# Patient Record
Sex: Male | Born: 1980 | Race: Black or African American | Hispanic: No | Marital: Single | Smoking: Current every day smoker
Health system: Southern US, Community
[De-identification: ages and names within clinical notes are randomized; demographics above are authoritative.]

## PROBLEM LIST (undated history)

## (undated) DIAGNOSIS — I639 Cerebral infarction, unspecified: Secondary | ICD-10-CM

## (undated) DIAGNOSIS — F329 Major depressive disorder, single episode, unspecified: Secondary | ICD-10-CM

## (undated) DIAGNOSIS — F32A Depression, unspecified: Secondary | ICD-10-CM

## (undated) DIAGNOSIS — F1910 Other psychoactive substance abuse, uncomplicated: Secondary | ICD-10-CM

## (undated) DIAGNOSIS — T7840XA Allergy, unspecified, initial encounter: Secondary | ICD-10-CM

## (undated) DIAGNOSIS — Z789 Other specified health status: Secondary | ICD-10-CM

## (undated) HISTORY — PX: OTHER SURGICAL HISTORY: SHX169

## (undated) HISTORY — DX: Other psychoactive substance abuse, uncomplicated: F19.10

## (undated) HISTORY — DX: Allergy, unspecified, initial encounter: T78.40XA

## (undated) HISTORY — DX: Cerebral infarction, unspecified: I63.9

---
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
## 2005-06-14 ENCOUNTER — Emergency Department: Payer: Self-pay | Admitting: Emergency Medicine

## 2005-08-24 ENCOUNTER — Emergency Department: Payer: Self-pay | Admitting: Emergency Medicine

## 2005-10-25 ENCOUNTER — Emergency Department: Payer: Self-pay | Admitting: Unknown Physician Specialty

## 2005-11-17 ENCOUNTER — Emergency Department: Payer: Self-pay | Admitting: Emergency Medicine

## 2005-12-20 ENCOUNTER — Emergency Department: Payer: Self-pay | Admitting: Emergency Medicine

## 2007-01-18 ENCOUNTER — Emergency Department: Payer: Self-pay

## 2009-04-13 ENCOUNTER — Emergency Department: Payer: Self-pay | Admitting: Internal Medicine

## 2009-08-24 ENCOUNTER — Emergency Department (HOSPITAL_COMMUNITY): Admission: EM | Admit: 2009-08-24 | Discharge: 2009-08-24 | Payer: Self-pay | Admitting: Emergency Medicine

## 2009-09-04 ENCOUNTER — Emergency Department: Payer: Self-pay | Admitting: Emergency Medicine

## 2010-06-14 ENCOUNTER — Emergency Department: Payer: Self-pay | Admitting: Emergency Medicine

## 2011-08-08 ENCOUNTER — Emergency Department: Payer: Self-pay | Admitting: Unknown Physician Specialty

## 2011-08-08 LAB — COMPREHENSIVE METABOLIC PANEL
Albumin: 4.3 g/dL (ref 3.4–5.0)
Alkaline Phosphatase: 63 U/L (ref 50–136)
Anion Gap: 9 (ref 7–16)
BUN: 6 mg/dL — ABNORMAL LOW (ref 7–18)
Bilirubin,Total: 0.3 mg/dL (ref 0.2–1.0)
Calcium, Total: 8.6 mg/dL (ref 8.5–10.1)
Chloride: 109 mmol/L — ABNORMAL HIGH (ref 98–107)
Co2: 28 mmol/L (ref 21–32)
Creatinine: 0.73 mg/dL (ref 0.60–1.30)
EGFR (African American): >60
EGFR (Non-African Amer.): >60
Glucose: 91 mg/dL (ref 65–99)
Osmolality: 288 (ref 275–301)
Potassium: 3.8 mmol/L (ref 3.5–5.1)
SGOT(AST): 19 U/L (ref 15–37)
SGPT (ALT): 19 U/L
Sodium: 146 mmol/L — ABNORMAL HIGH (ref 136–145)
Total Protein: 8.1 g/dL (ref 6.4–8.2)

## 2011-08-08 LAB — CK TOTAL AND CKMB (NOT AT ARMC)
CK, Total: 122 U/L (ref 35–232)
CK-MB: 1.2 ng/mL (ref 0.5–3.6)

## 2011-08-08 LAB — CBC
HCT: 47.2 % (ref 40.0–52.0)
HGB: 15.8 g/dL (ref 13.0–18.0)
MCH: 31.7 pg (ref 26.0–34.0)
MCHC: 33.4 g/dL (ref 32.0–36.0)
MCV: 95 fL (ref 80–100)
Platelet: 188 10*3/uL (ref 150–440)
RBC: 4.98 10*6/uL (ref 4.40–5.90)
RDW: 14.2 % (ref 11.5–14.5)
WBC: 7.7 10*3/uL (ref 3.8–10.6)

## 2011-08-08 LAB — TROPONIN I: Troponin-I: <0.02 ng/mL

## 2014-01-21 ENCOUNTER — Emergency Department: Payer: Self-pay | Admitting: Emergency Medicine

## 2014-03-31 ENCOUNTER — Emergency Department: Payer: Self-pay | Admitting: Emergency Medicine

## 2014-12-05 ENCOUNTER — Emergency Department
Admission: EM | Admit: 2014-12-05 | Discharge: 2014-12-05 | Disposition: A | Discharge status: Home / Self Care | Payer: Self-pay | Attending: Emergency Medicine | Admitting: Emergency Medicine

## 2014-12-05 ENCOUNTER — Encounter: Payer: Self-pay | Admitting: Emergency Medicine

## 2014-12-05 DIAGNOSIS — X58XXXA Exposure to other specified factors, initial encounter: Secondary | ICD-10-CM | POA: Insufficient documentation

## 2014-12-05 DIAGNOSIS — Y990 Civilian activity done for income or pay: Secondary | ICD-10-CM | POA: Insufficient documentation

## 2014-12-05 DIAGNOSIS — Z789 Other specified health status: Secondary | ICD-10-CM

## 2014-12-05 DIAGNOSIS — S39012A Strain of muscle, fascia and tendon of lower back, initial encounter: Secondary | ICD-10-CM | POA: Insufficient documentation

## 2014-12-05 DIAGNOSIS — Y9289 Other specified places as the place of occurrence of the external cause: Secondary | ICD-10-CM | POA: Insufficient documentation

## 2014-12-05 DIAGNOSIS — Y9389 Activity, other specified: Secondary | ICD-10-CM | POA: Insufficient documentation

## 2014-12-05 DIAGNOSIS — T148XXA Other injury of unspecified body region, initial encounter: Secondary | ICD-10-CM

## 2014-12-05 HISTORY — DX: Other specified health status: Z78.9

## 2014-12-05 MED ORDER — CYCLOBENZAPRINE HCL 10 MG PO TABS
10.0000 mg | ORAL_TABLET | Freq: Once | ORAL | Status: AC
Start: 2014-12-05 — End: 2014-12-05
  Administered 2014-12-05: 10 mg via ORAL

## 2014-12-05 MED ORDER — IBUPROFEN 800 MG PO TABS: 800.0000 mg | ORAL_TABLET | Freq: Three times a day (TID) | ORAL | Status: DC | PRN

## 2014-12-05 MED ORDER — CYCLOBENZAPRINE HCL 5 MG PO TABS: 5.0000 mg | ORAL_TABLET | Freq: Three times a day (TID) | ORAL | Status: DC | PRN

## 2014-12-05 MED ORDER — IBUPROFEN 800 MG PO TABS
ORAL_TABLET | ORAL | Status: AC
  Filled 2014-12-05: qty 1

## 2014-12-05 MED ORDER — IBUPROFEN 800 MG PO TABS
800.0000 mg | ORAL_TABLET | Freq: Once | ORAL | Status: AC
  Administered 2014-12-05: 800 mg via ORAL

## 2014-12-05 MED ORDER — CYCLOBENZAPRINE HCL 10 MG PO TABS
ORAL_TABLET | ORAL | Status: AC
  Filled 2014-12-05: qty 1

## 2014-12-05 NOTE — Discharge Instructions (Signed)
Take medication as prescribed. Stretch well daily. Avoid strenuous activity.   Follow up with your primary care physician or the above.   Muscle Strain A muscle strain is an injury that occurs when a muscle is stretched beyond its normal length. Usually a small number of muscle fibers are torn when this happens. Muscle strain is rated in degrees. First-degree strains have the least amount of muscle fiber tearing and pain. Second-degree and third-degree strains have increasingly more tearing and pain.  Usually, recovery from muscle strain takes 1-2 weeks. Complete healing takes 5-6 weeks.  CAUSES  Muscle strain happens when a sudden, violent force placed on a muscle stretches it too far. This may occur with lifting, sports, or a fall.  RISK FACTORS Muscle strain is especially common in athletes.  SIGNS AND SYMPTOMS At the site of the muscle strain, there may be:  Pain.  Bruising.  Swelling.  Difficulty using the muscle due to pain or lack of normal function. DIAGNOSIS  Your health care provider will perform a physical exam and ask about your medical history. TREATMENT  Often, the best treatment for a muscle strain is resting, icing, and applying cold compresses to the injured area.  HOME CARE INSTRUCTIONS   Use the PRICE method of treatment to promote muscle healing during the first 2-3 days after your injury. The PRICE method involves:  Protecting the muscle from being injured again.  Restricting your activity and resting the injured body part.  Icing your injury. To do this, put ice in a plastic bag. Place a towel between your skin and the bag. Then, apply the ice and leave it on from 15-20 minutes each hour. After the third day, switch to moist heat packs.  Apply compression to the injured area with a splint or elastic bandage. Be careful not to wrap it too tightly. This may interfere with blood circulation or increase swelling.  Elevate the injured body part above the level  of your heart as often as you can.  Only take over-the-counter or prescription medicines for pain, discomfort, or fever as directed by your health care provider.  Warming up prior to exercise helps to prevent future muscle strains. SEEK MEDICAL CARE IF:   You have increasing pain or swelling in the injured area.  You have numbness, tingling, or a significant loss of strength in the injured area. MAKE SURE YOU:   Understand these instructions.  Will watch your condition.  Will get help right away if you are not doing well or get worse. Document Released: 06/28/2005 Document Revised: 04/18/2013 Document Reviewed: 01/25/2013 [facility] Patient Information 2015 [place], [place]. This information is not intended to replace advice given to you by your health care provider. Make sure you discuss any questions you have with your health care provider.

## 2014-12-05 NOTE — ED Provider Notes (Signed)
[facility] Emergency Department Provider Note  ____________________________________________  Time seen: Approximately 11:38 AM  I have reviewed the triage vital signs and the nursing notes.   HISTORY  Chief Complaint Back Pain   HPI Andrew Long is a 34 y.o. male presents for the complaint of low back pain. States pain intermittent in low back for approximately 6 months. Patient states that he has recently started new job with frequent heavy pushing and pulling. States 2 days ago at work he had to push a very heavy load and since then has had pain in his low back. Denies fall or injury. Reports pain gradual onset. States pain initially was just a burning sensation now sharp with movement. Patient states pain is currently only with movement.  States pain has intermittently been occurring similarly the last 6 months. States will gradually come on for 2 or 3 days then fully resolved. States pain currently 7 out of 10. Describes sharp with movement.  Denies pain radiation. Again denies fall or injury. Denies chest pain, shortness of breath, abdominal pain, nausea, vomiting, diarrhea, dysuria.   Denies workers comp Past Medical History  Diagnosis Date  . Patient denies medical problems 12/05/14    denies    There are no active problems to display for this patient.   Past Surgical History  Procedure Laterality Date  . Denies      denies surgeries    Current Outpatient Rx  Name  Route  Sig  Dispense  Refill  . cyclobenzaprine (FLEXERIL) 5 MG tablet   Oral   Take 1 tablet (5 mg total) by mouth every 8 (eight) hours as needed for muscle spasms (or pain. Do not drive or operate machinery while taking as can cause drowsiness.).   12 tablet   0   . ibuprofen (ADVIL,MOTRIN) 800 MG tablet   Oral   Take 1 tablet (800 mg total) by mouth every 8 (eight) hours as needed for mild pain or moderate pain.   15 tablet   0     Allergies Review of patient's  allergies indicates no known allergies.  No family history on file.  Social History History  Substance Use Topics  . Smoking status: Current Every Day Smoker  . Smokeless tobacco: Not on file  . Alcohol Use: Yes    Review of Systems Constitutional: No fever/chills Eyes: No visual changes. ENT: No sore throat. Cardiovascular: Denies chest pain. Respiratory: Denies shortness of breath. Gastrointestinal: No abdominal pain.  No nausea, no vomiting.  No diarrhea.  No constipation. Genitourinary: Negative for dysuria. Musculoskeletal: positive for back pain. Skin: Negative for rash. Neurological: Negative for headaches, focal weakness or numbness.  10-point ROS otherwise negative.  ____________________________________________   PHYSICAL EXAM:  VITAL SIGNS: ED Triage Vitals  Enc Vitals Group     BP 12/05/14 0917 124/81 mmHg     Pulse Rate 12/05/14 0917 102     Resp 12/05/14 0917 18     Temp 12/05/14 0917 98.1 F (36.7 C)     Temp Source 12/05/14 0917 Oral     SpO2 12/05/14 0917 100 %     Weight 12/05/14 0917 145 lb (65.772 kg)     Height 12/05/14 0917 6' (1.829 m)     Head Cir --      Peak Flow --      Pain Score 12/05/14 0933 10     Pain Loc --      Pain Edu? --  Excl. in GC? --     Constitutional: Sleeping soundly in room, awoke quickly with calling pt's name.Alert and oriented. Well appearing and in no acute distress. Eyes: Conjunctivae are normal. PERRL. EOMI. Head: Atraumatic. Nose: No congestion/rhinnorhea. Mouth/Throat: Mucous membranes are moist.  Oropharynx non-erythematous. Neck: No stridor.  No cervical spine tenderness to palpation. Hematological/Lymphatic/Immunilogical: No cervical lymphadenopathy. Cardiovascular: Normal rate, regular rhythm. Grossly normal heart sounds.  Good peripheral circulation. Respiratory: Normal respiratory effort.  No retractions. Lungs CTAB. Gastrointestinal: Soft and nontender. No distention. No abdominal bruits. No CVA  tenderness. Musculoskeletal: No lower extremity tenderness nor edema.  No joint effusions.Mild paralumbar tenderness to palpation. NO midline tenderness. Changes position from lying to standing very quickly without pain or distress. Bilateral straight leg  raise test negative. Distal pedal pulses equal bilaterally. No midline tenderness. No cervical or thoracic tenderness to palpation.  Neurologic:  Normal speech and language. No gross focal neurologic deficits are appreciated. Speech is normal. No gait instability. Skin:  Skin is warm, dry and intact. No rash noted. Psychiatric: Mood and affect are normal. Speech and behavior are normal.  ________________________________________  RADIOLOGY  Discussed these x-ray and evaluation. Patient refuses x-ray.  ____________________________________________    INITIAL IMPRESSION / ASSESSMENT AND PLAN / ED COURSE  Pertinent labs & imaging results that were available during my care of the patient were reviewed by me and considered in my medical decision making (see chart for details).  No acute distress. Very well-appearing patient. Ambulatory in the hallway. Changes position quickly without distress. No midline tenderness on exam. No recent fall or direct injury. Reports frequent pushing and pulling heavy items at work. Complains of pain that is sharp only with movement.   Upon entering the room for exam and patient sleeping in room, awoke quickly with verbal stimulation. Suspect muscular strain. Discussed proper body mechanics and stretching. Will treat with oral Flexeril as needed and ibuprofen. Follow up with primary care physician.__   FINAL CLINICAL IMPRESSION(S) / ED DIAGNOSES  Final diagnoses:  Lumbosacral strain, initial encounter  Muscle strain      Renford Dills, NP 12/05/14 1203  Richardean Canal, MD 12/07/14 [phone]
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
## 2014-12-05 NOTE — ED Notes (Signed)
Pt reports back pain for 6 months from lower back to his mid back. Pt reports was unable to go to work today because of it. Pt reports lifts heavy things at work. States he does not want to file WC.

## 2014-12-14 ENCOUNTER — Encounter: Payer: Self-pay | Admitting: Emergency Medicine

## 2014-12-14 ENCOUNTER — Emergency Department: Payer: Self-pay

## 2014-12-14 ENCOUNTER — Emergency Department
Admission: EM | Admit: 2014-12-14 | Discharge: 2014-12-14 | Disposition: A | Discharge status: Home / Self Care | Payer: Self-pay | Attending: Emergency Medicine | Admitting: Emergency Medicine

## 2014-12-14 DIAGNOSIS — W1839XA Other fall on same level, initial encounter: Secondary | ICD-10-CM | POA: Insufficient documentation

## 2014-12-14 DIAGNOSIS — Y9389 Activity, other specified: Secondary | ICD-10-CM | POA: Insufficient documentation

## 2014-12-14 DIAGNOSIS — Y998 Other external cause status: Secondary | ICD-10-CM | POA: Insufficient documentation

## 2014-12-14 DIAGNOSIS — S82401A Unspecified fracture of shaft of right fibula, initial encounter for closed fracture: Secondary | ICD-10-CM

## 2014-12-14 DIAGNOSIS — Y9289 Other specified places as the place of occurrence of the external cause: Secondary | ICD-10-CM | POA: Insufficient documentation

## 2014-12-14 DIAGNOSIS — Z720 Tobacco use: Secondary | ICD-10-CM | POA: Insufficient documentation

## 2014-12-14 DIAGNOSIS — S82831A Other fracture of upper and lower end of right fibula, initial encounter for closed fracture: Secondary | ICD-10-CM | POA: Insufficient documentation

## 2014-12-14 MED ORDER — IBUPROFEN 800 MG PO TABS
800.0000 mg | ORAL_TABLET | Freq: Three times a day (TID) | ORAL | Status: DC | PRN
Start: 2014-12-14 — End: 2015-10-08

## 2014-12-14 MED ORDER — HYDROCODONE-ACETAMINOPHEN 5-325 MG PO TABS
ORAL_TABLET | ORAL | Status: AC
  Filled 2014-12-14: qty 2

## 2014-12-14 MED ORDER — HYDROCODONE-ACETAMINOPHEN 5-325 MG PO TABS
2.0000 | ORAL_TABLET | Freq: Once | ORAL | Status: AC
  Administered 2014-12-14: 2 via ORAL

## 2014-12-14 MED ORDER — OXYCODONE-ACETAMINOPHEN 5-325 MG PO TABS: 1.0000 | ORAL_TABLET | ORAL | Status: DC | PRN

## 2014-12-14 NOTE — ED Provider Notes (Signed)
[facility] Emergency Department Provider Note  ____________________________________________  Time seen: Approximately 8:55 AM  I have reviewed the triage vital signs and the nursing notes.   HISTORY  Chief Complaint Ankle Pain    HPI Andrew Long is a 34 y.o. male presents emergency room for evaluation of right ankle pain. States he fell last night. Did not present because it had "too much fine". As of increased swelling and pain to right ankle today. Unable to bear weight. Rates pain is 10 over 10.   Past Medical History  Diagnosis Date  . Patient denies medical problems 12/05/14    denies    There are no active problems to display for this patient.   Past Surgical History  Procedure Laterality Date  . Denies      denies surgeries    Current Outpatient Rx  Name  Route  Sig  Dispense  Refill  . ibuprofen (ADVIL,MOTRIN) 800 MG tablet   Oral   Take 1 tablet (800 mg total) by mouth every 8 (eight) hours as needed.   30 tablet   0   . oxyCODONE-acetaminophen (ROXICET) 5-325 MG per tablet   Oral   Take 1-2 tablets by mouth every 4 (four) hours as needed for severe pain.   15 tablet   0     Allergies Review of patient's allergies indicates no known allergies.  No family history on file.  Social History History  Substance Use Topics  . Smoking status: Current Every Day Smoker -- 0.30 packs/day    Types: Cigarettes  . Smokeless tobacco: Not on file  . Alcohol Use: 8.4 oz/week    14 Standard drinks or equivalent per week    Review of Systems Constitutional: No fever/chills Eyes: No visual changes. ENT: No sore throat. Cardiovascular: Denies chest pain. Respiratory: Denies shortness of breath. Gastrointestinal: No abdominal pain.  No nausea, no vomiting.  No diarrhea.  No constipation. Genitourinary: Negative for dysuria. Musculoskeletal: Positive for right ankle pain. Skin: Negative for rash. Neurological: Negative for  headaches, focal weakness or numbness.  10-point ROS otherwise negative.  ____________________________________________   PHYSICAL EXAM:  VITAL SIGNS: ED Triage Vitals  Enc Vitals Group     BP --      Pulse --      Resp --      Temp --      Temp src --      SpO2 --      Weight --      Height --      Head Cir --      Peak Flow --      Pain Score --      Pain Loc --      Pain Edu? --      Excl. in GC? --     Constitutional: Alert and oriented. Well appearing and in no acute distress. Eyes: Conjunctivae are normal. PERRL. EOMI. Head: Atraumatic. Nose: No congestion/rhinnorhea. Mouth/Throat: Mucous membranes are moist.  Oropharynx non-erythematous. Neck: No stridor.   Cardiovascular: Normal rate, regular rhythm. Grossly normal heart sounds.  Good peripheral circulation. Respiratory: Normal respiratory effort.  No retractions. Lungs CTAB. Gastrointestinal: Soft and nontender. No distention. No abdominal bruits. No CVA tenderness. Musculoskeletal: Positive right lower ankle extremity injury with swelling. Decreased range of motion unable to flex extend abduct or abduct Neurologic:  Normal speech and language. No gross focal neurologic deficits are appreciated. Speech is normal. Gait not tested due to pain. Neurovascularly intact distally.  Skin:  Skin is warm, dry and intact. No rash noted. Psychiatric: Mood and affect are normal. Speech and behavior are normal.  ____________________________________________   LABS (all labs ordered are listed, but only abnormal results are displayed)  Labs Reviewed - No data to display ____________________________________________  EKG  Deferred ____________________________________________  RADIOLOGY turbid by radiologist, reviewed by myself. Distal fibular fracture ____________________________________________   PROCEDURES  Procedure(s) performed: None  Critical Care performed:  No  ____________________________________________   INITIAL IMPRESSION / ASSESSMENT AND PLAN / ED COURSE  Pertinent labs & imaging results that were available during my care of the patient were reviewed by me and considered in my medical decision making (see chart for details).  Discussed all findings with patient. Diagnosed with a fibular fracture. Stirrup splint placed Rx given for Percocet and Motrin crutches provided. Patient follow-up with orthopedics as directed on-call information given. Patient voices no other emergency medical complaints at this time. ____________________________________________   FINAL CLINICAL IMPRESSION(S) / ED DIAGNOSES  Final diagnoses:  Right fibular fracture, closed, initial encounter       Evangeline Dakin, PA-C 12/14/14 4098  Governor Rooks, MD 12/14/14 [phone]

## 2014-12-14 NOTE — ED Notes (Signed)
Fell last night, pain R ankle, denies other injury

## 2014-12-14 NOTE — Discharge Instructions (Signed)
Fibular Fracture, Ankle, Adult, Undisplaced, Treated With Immobilization °A simple fracture of the bone below the knee on the outside of your leg (fibula) usually heals without problems. °CAUSES °Typically, a fibular fracture occurs as a result of trauma. A blow to the side of your leg or a powerful twisting movement can cause a fracture. Fibular fractures are often seen as a result of football, soccer, or skiing injuries. °SYMPTOMS °Symptoms of a fibular fracture can include: °· Pain. °· Shortening or abnormal alignment of your lower leg (angulation). °DIAGNOSIS °A health care provider will need to examine the leg. X-ray exams will be ordered for further to confirm the fracture and evaluate the extent and of the injury. °TREATMENT  °Typically, a cast or immobilizer is applied. Sometimes a splint is placed on these fractures if it is needed for comfort or if the bones are badly out of place. Crutches may be needed to help you get around.  °HOME CARE INSTRUCTIONS  °· Apply ice to the injured area: °¨ Put ice in a plastic bag. °¨ Place a towel between your skin and the bag. °¨ Leave the ice on for 20 minutes, 2-3 times a day. °· Use crutches as directed. Resume walking without crutches as directed by your health care provider or when comfortable doing so. °· Only take over-the-counter or prescription medicines for pain, discomfort, or fever as directed by your health care provider. °· Keeping your leg raised may lessen swelling. °· If you have a removable splint or boot, do not remove the boot unless directed by your health care provider. °· Do not not drive a car or operate a motor vehicle until your health care provider specifically tells you it is safe to do so. °SEEK IMMEDIATE MEDICAL CARE IF:  °· Your cast gets damaged or breaks. °· You have continued severe pain or more swelling than you did before the cast was put on, or the pain is not controlled with medications. °· Your skin or nails below the injury turn  blue or grey, or feel cold or numb. °· There is a bad smell or pus coming from under the cast. °· You develop severe pain in ankle or foot. °MAKE SURE YOU:  °· Understand these instructions. °· Will watch your condition. °· Will get help right away if you are not doing well or get worse. °Document Released: 03/20/2002 Document Revised: 04/18/2013 Document Reviewed: 02/07/2013 °ExitCare® Patient Information ©2015 [employer]. This information is not intended to replace advice given to you by your health care provider. Make sure you discuss any questions you have with your health care provider. ° °

## 2015-10-08 ENCOUNTER — Emergency Department
Admission: EM | Admit: 2015-10-08 | Discharge: 2015-10-08 | Disposition: A | Discharge status: Home / Self Care | Payer: Self-pay | Attending: Emergency Medicine | Admitting: Emergency Medicine

## 2015-10-08 ENCOUNTER — Encounter: Payer: Self-pay | Admitting: Emergency Medicine

## 2015-10-08 DIAGNOSIS — X501XXA Overexertion from prolonged static or awkward postures, initial encounter: Secondary | ICD-10-CM | POA: Insufficient documentation

## 2015-10-08 DIAGNOSIS — Y9389 Activity, other specified: Secondary | ICD-10-CM | POA: Insufficient documentation

## 2015-10-08 DIAGNOSIS — Y929 Unspecified place or not applicable: Secondary | ICD-10-CM | POA: Insufficient documentation

## 2015-10-08 DIAGNOSIS — Y998 Other external cause status: Secondary | ICD-10-CM | POA: Insufficient documentation

## 2015-10-08 DIAGNOSIS — S39012A Strain of muscle, fascia and tendon of lower back, initial encounter: Secondary | ICD-10-CM | POA: Insufficient documentation

## 2015-10-08 DIAGNOSIS — F17210 Nicotine dependence, cigarettes, uncomplicated: Secondary | ICD-10-CM | POA: Insufficient documentation

## 2015-10-08 MED ORDER — OXYCODONE-ACETAMINOPHEN 5-325 MG PO TABS: 1.0000 | ORAL_TABLET | ORAL | Status: DC | PRN

## 2015-10-08 MED ORDER — NAPROXEN 500 MG PO TABS: 500.0000 mg | ORAL_TABLET | Freq: Two times a day (BID) | ORAL | Status: DC

## 2015-10-08 MED ORDER — CYCLOBENZAPRINE HCL 10 MG PO TABS
10.0000 mg | ORAL_TABLET | Freq: Once | ORAL | Status: AC
  Administered 2015-10-08: 10 mg via ORAL
  Filled 2015-10-08: qty 1

## 2015-10-08 MED ORDER — OXYCODONE-ACETAMINOPHEN 5-325 MG PO TABS
2.0000 | ORAL_TABLET | Freq: Once | ORAL | Status: AC
  Administered 2015-10-08: 2 via ORAL
  Filled 2015-10-08: qty 2

## 2015-10-08 MED ORDER — CYCLOBENZAPRINE HCL 10 MG PO TABS: 10.0000 mg | ORAL_TABLET | Freq: Three times a day (TID) | ORAL | Status: DC | PRN

## 2015-10-08 MED ORDER — KETOROLAC TROMETHAMINE 30 MG/ML IJ SOLN
30.0000 mg | Freq: Once | INTRAMUSCULAR | Status: AC
  Administered 2015-10-08: 30 mg via INTRAMUSCULAR
  Filled 2015-10-08: qty 1

## 2015-10-08 NOTE — ED Provider Notes (Signed)
[facility] Emergency Department Provider Note  ____________________________________________  Time seen: Approximately 11:57 AM  I have reviewed the triage vital signs and the nursing notes.   HISTORY  Chief Complaint Back Pain    HPI Andrew Long is a 35 y.o. male presents for evaluation of mid to lower back pain. Patient states that he was moving furniture yesterday when he twisted and fell some point in his lower back. Complains of difficulty ambulating and sleeping secondary to the pain. Has tried Motrin over-the-counter with no relief. Scribe's pain as a 10 over 10, nonradiating no numbness or tingling.   Past Medical History  Diagnosis Date  . Patient denies medical problems 12/05/14    denies    There are no active problems to display for this patient.   Past Surgical History  Procedure Laterality Date  . Denies      denies surgeries    Current Outpatient Rx  Name  Route  Sig  Dispense  Refill  . cyclobenzaprine (FLEXERIL) 10 MG tablet   Oral   Take 1 tablet (10 mg total) by mouth every 8 (eight) hours as needed for muscle spasms.   30 tablet   1   . naproxen (NAPROSYN) 500 MG tablet   Oral   Take 1 tablet (500 mg total) by mouth 2 (two) times daily with a meal.   60 tablet   0   . oxyCODONE-acetaminophen (ROXICET) 5-325 MG tablet   Oral   Take 1-2 tablets by mouth every 4 (four) hours as needed for severe pain.   15 tablet   0     Allergies Tramadol  No family history on file.  Social History Social History  Substance Use Topics  . Smoking status: Current Every Day Smoker -- 0.30 packs/day    Types: Cigarettes  . Smokeless tobacco: None  . Alcohol Use: 8.4 oz/week    14 Standard drinks or equivalent per week    Review of Systems Constitutional: No fever/chills Cardiovascular: Denies chest pain. Respiratory: Denies shortness of breath. Gastrointestinal: No abdominal pain.  No nausea, no vomiting.  No diarrhea.   No constipation. Genitourinary: Negative for dysuria. Musculoskeletal: Positive for low back pain. Skin: Negative for rash. Neurological: Negative for headaches, focal weakness or numbness.  10-point ROS otherwise negative.  ____________________________________________   PHYSICAL EXAM:  VITAL SIGNS: ED Triage Vitals  Enc Vitals Group     BP 10/08/15 1130 124/77 mmHg     Pulse Rate 10/08/15 1130 128     Resp 10/08/15 1130 18     Temp 10/08/15 1130 98.1 F (36.7 C)     Temp Source 10/08/15 1130 Oral     SpO2 10/08/15 1130 98 %     Weight 10/08/15 1130 145 lb (65.772 kg)     Height 10/08/15 1130  (1.803 m)     Head Cir --      Peak Flow --      Pain Score 10/08/15 1130 10     Pain Loc --      Pain Edu? --      Excl. in GC? --     Constitutional: Alert and oriented. Well appearing and in no acute distress.  Cardiovascular: Normal rate, regular rhythm. Grossly normal heart sounds.  Good peripheral circulation. Respiratory: Normal respiratory effort.  No retractions. Lungs CTAB. Musculoskeletal: Point tenderness to the paraspinal lumbar muscle area. Straight leg raise positive at 20 bilaterally. Ambulates methodically and with a limp. Distally neurovascularly  and intact reflexes equal bilaterally Neurologic:  Normal speech and language. No gross focal neurologic deficits are appreciated. No gait instability. Skin:  Skin is warm, dry and intact. No rash noted. Psychiatric: Mood and affect are normal. Speech and behavior are normal.  ____________________________________________   LABS (all labs ordered are listed, but only abnormal results are displayed)  Labs Reviewed - No data to display ____________________________________________    PROCEDURES  Procedure(s) performed: None  Critical Care performed: No  ____________________________________________   INITIAL IMPRESSION / ASSESSMENT AND PLAN / ED COURSE  Pertinent labs & imaging results that were  available during my care of the patient were reviewed by me and considered in my medical decision making (see chart for details).  Acute lumbar sacral strain. Patient was given Percocet and Flexeril while in the ED. Rx given for Flexeril 10 mg 3 times a day, Naprosyn 500 mg 3 times a day and Percocet 5/325 as needed for severe pain only. Patient follow-up with PCP continue to use heat and ice as needed for comfort. Work excuse given 48 hours. ____________________________________________   FINAL CLINICAL IMPRESSION(S) / ED DIAGNOSES  Final diagnoses:  Lumbar strain, initial encounter     This chart was dictated using voice recognition software/Dragon. Despite best efforts to proofread, errors can occur which can change the meaning. Any change was purely unintentional.   Evangeline Dakinharles M Melissa Tomaselli, PA-C 10/08/15 1208  Emily FilbertJonathan E Williams, MD 10/08/15 1215

## 2015-10-08 NOTE — ED Notes (Signed)
Pt presents to ED with mid back pain. Pt states moving furniture yesterday.

## 2015-10-08 NOTE — Discharge Instructions (Signed)

## 2016-11-29 ENCOUNTER — Emergency Department: Payer: Self-pay

## 2016-11-29 ENCOUNTER — Emergency Department
Admission: EM | Admit: 2016-11-29 | Discharge: 2016-11-29 | Disposition: A | Discharge status: Home / Self Care | Payer: Self-pay | Attending: Emergency Medicine | Admitting: Emergency Medicine

## 2016-11-29 DIAGNOSIS — S300XXA Contusion of lower back and pelvis, initial encounter: Secondary | ICD-10-CM | POA: Insufficient documentation

## 2016-11-29 DIAGNOSIS — Y9389 Activity, other specified: Secondary | ICD-10-CM | POA: Insufficient documentation

## 2016-11-29 DIAGNOSIS — F17210 Nicotine dependence, cigarettes, uncomplicated: Secondary | ICD-10-CM | POA: Insufficient documentation

## 2016-11-29 DIAGNOSIS — S4992XA Unspecified injury of left shoulder and upper arm, initial encounter: Secondary | ICD-10-CM

## 2016-11-29 DIAGNOSIS — Y929 Unspecified place or not applicable: Secondary | ICD-10-CM | POA: Insufficient documentation

## 2016-11-29 DIAGNOSIS — S50312A Abrasion of left elbow, initial encounter: Secondary | ICD-10-CM | POA: Insufficient documentation

## 2016-11-29 DIAGNOSIS — Y999 Unspecified external cause status: Secondary | ICD-10-CM | POA: Insufficient documentation

## 2016-11-29 MED ORDER — FENTANYL CITRATE (PF) 100 MCG/2ML IJ SOLN
50.0000 ug | Freq: Once | INTRAMUSCULAR | Status: AC
  Administered 2016-11-29: 50 ug via INTRAVENOUS
  Filled 2016-11-29: qty 2
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
## 2016-11-29 NOTE — ED Notes (Signed)
Pt refused ice pack, left ice pack at bedside
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
## 2016-11-29 NOTE — ED Notes (Signed)
[place] at bedside talking to pt, pt ambulatory to restroom with no assistance steady gait.

## 2016-11-30 ENCOUNTER — Emergency Department
Admission: EM | Admit: 2016-11-30 | Discharge: 2016-11-30 | Disposition: A | Discharge status: Home / Self Care | Payer: Self-pay | Attending: Emergency Medicine | Admitting: Emergency Medicine

## 2016-11-30 ENCOUNTER — Emergency Department: Payer: Self-pay

## 2016-11-30 ENCOUNTER — Encounter: Payer: Self-pay | Admitting: Emergency Medicine

## 2016-11-30 DIAGNOSIS — Y939 Activity, unspecified: Secondary | ICD-10-CM | POA: Insufficient documentation

## 2016-11-30 DIAGNOSIS — Y929 Unspecified place or not applicable: Secondary | ICD-10-CM | POA: Insufficient documentation

## 2016-11-30 DIAGNOSIS — F17210 Nicotine dependence, cigarettes, uncomplicated: Secondary | ICD-10-CM | POA: Insufficient documentation

## 2016-11-30 DIAGNOSIS — S82092A Other fracture of left patella, initial encounter for closed fracture: Secondary | ICD-10-CM | POA: Insufficient documentation

## 2016-11-30 DIAGNOSIS — S20212A Contusion of left front wall of thorax, initial encounter: Secondary | ICD-10-CM | POA: Insufficient documentation

## 2016-11-30 DIAGNOSIS — Y999 Unspecified external cause status: Secondary | ICD-10-CM | POA: Insufficient documentation

## 2016-11-30 MED ORDER — IBUPROFEN 600 MG PO TABS
600.0000 mg | ORAL_TABLET | Freq: Once | ORAL | Status: AC
  Administered 2016-11-30: 600 mg via ORAL
  Filled 2016-11-30: qty 1

## 2016-11-30 MED ORDER — IBUPROFEN 600 MG PO TABS: 600.0000 mg | ORAL_TABLET | Freq: Three times a day (TID) | ORAL | 0 refills | Status: DC | PRN

## 2016-11-30 MED ORDER — HYDROCODONE-ACETAMINOPHEN 5-325 MG PO TABS
1.0000 | ORAL_TABLET | Freq: Once | ORAL | Status: AC
  Administered 2016-11-30: 1 via ORAL
  Filled 2016-11-30: qty 1

## 2016-11-30 MED ORDER — HYDROCODONE-ACETAMINOPHEN 5-325 MG PO TABS: ORAL_TABLET | ORAL | 0 refills | Status: DC

## 2016-11-30 NOTE — ED Notes (Signed)
NAD noted at time of D/C. Pt taken to the lobby via wheelchair. Pt denies comments/concerns regarding  D/C at this time.  
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
## 2016-11-30 NOTE — Discharge Instructions (Signed)
Ice and elevate your knee when at home. Wear knee immobilizer for support and use crutches. You do not have to wear your knee immobilizer while sleeping. Call make an appointment with Dr. Ernest PineHooten.  Take pain medication only as directed 1 tablet every 4-6 hours if needed for pain. Ibuprofen 600 mg 1 every 8 hours with food.
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
## 2017-02-25 ENCOUNTER — Emergency Department
Admission: EM | Admit: 2017-02-25 | Discharge: 2017-02-25 | Disposition: A | Discharge status: Home / Self Care | Payer: Self-pay | Attending: Emergency Medicine | Admitting: Emergency Medicine

## 2017-02-25 DIAGNOSIS — F329 Major depressive disorder, single episode, unspecified: Secondary | ICD-10-CM | POA: Insufficient documentation

## 2017-02-25 DIAGNOSIS — F10920 Alcohol use, unspecified with intoxication, uncomplicated: Secondary | ICD-10-CM | POA: Insufficient documentation

## 2017-02-25 DIAGNOSIS — F1410 Cocaine abuse, uncomplicated: Secondary | ICD-10-CM | POA: Insufficient documentation

## 2017-02-25 DIAGNOSIS — F32A Depression, unspecified: Secondary | ICD-10-CM

## 2017-02-25 DIAGNOSIS — F17210 Nicotine dependence, cigarettes, uncomplicated: Secondary | ICD-10-CM | POA: Insufficient documentation

## 2017-02-25 DIAGNOSIS — R45851 Suicidal ideations: Secondary | ICD-10-CM | POA: Insufficient documentation

## 2017-02-25 LAB — CBC
HCT: 42.2 % (ref 40.0–52.0)
Hemoglobin: 14.3 g/dL (ref 13.0–18.0)
MCH: 30.7 pg (ref 26.0–34.0)
MCHC: 33.8 g/dL (ref 32.0–36.0)
MCV: 90.9 fL (ref 80.0–100.0)
Platelets: 205 10*3/uL (ref 150–440)
RBC: 4.65 MIL/uL (ref 4.40–5.90)
RDW: 16.2 % — ABNORMAL HIGH (ref 11.5–14.5)
WBC: 5.7 10*3/uL (ref 3.8–10.6)

## 2017-02-25 LAB — COMPREHENSIVE METABOLIC PANEL
ALT: 38 U/L (ref 17–63)
AST: 47 U/L — ABNORMAL HIGH (ref 15–41)
Albumin: 4.4 g/dL (ref 3.5–5.0)
Alkaline Phosphatase: 60 U/L (ref 38–126)
Anion gap: 9 (ref 5–15)
BUN: 6 mg/dL (ref 6–20)
CO2: 24 mmol/L (ref 22–32)
Calcium: 8.7 mg/dL — ABNORMAL LOW (ref 8.9–10.3)
Chloride: 109 mmol/L (ref 101–111)
Creatinine, Ser: 0.79 mg/dL (ref 0.61–1.24)
GFR calc Af Amer: >60 mL/min (ref >60)
GFR calc non Af Amer: >60 mL/min (ref >60)
Glucose, Bld: 98 mg/dL (ref 65–99)
Potassium: 3.6 mmol/L (ref 3.5–5.1)
Sodium: 142 mmol/L (ref 135–145)
Total Bilirubin: 0.7 mg/dL (ref 0.3–1.2)
Total Protein: 8.2 g/dL — ABNORMAL HIGH (ref 6.5–8.1)

## 2017-02-25 LAB — ETHANOL: Alcohol, Ethyl (B): 230 mg/dL — ABNORMAL HIGH (ref <5)

## 2017-02-25 LAB — URINE DRUG SCREEN, QUALITATIVE (ARMC ONLY)
Amphetamines, Ur Screen: NOT DETECTED
Barbiturates, Ur Screen: NOT DETECTED
Benzodiazepine, Ur Scrn: NOT DETECTED
Cannabinoid 50 Ng, Ur ~~LOC~~: NOT DETECTED
Cocaine Metabolite,Ur ~~LOC~~: POSITIVE — AB
MDMA (Ecstasy)Ur Screen: NOT DETECTED
Methadone Scn, Ur: NOT DETECTED
Opiate, Ur Screen: NOT DETECTED
Phencyclidine (PCP) Ur S: NOT DETECTED
Tricyclic, Ur Screen: NOT DETECTED

## 2017-02-25 LAB — SALICYLATE LEVEL: Salicylate Lvl: <7 mg/dL (ref 2.8–30.0)

## 2017-02-25 LAB — ACETAMINOPHEN LEVEL: Acetaminophen (Tylenol), Serum: <10 ug/mL — ABNORMAL LOW (ref 10–30)

## 2017-02-25 NOTE — Discharge Instructions (Signed)
Return to the ER for worsening symptoms, feelings of hurting yourself or others, or concerns.

## 2017-02-25 NOTE — ED Triage Notes (Signed)
Patient c/o suicidal ideation with a plan.  Refuses to tell this RN what his plan is.
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
## 2017-02-25 NOTE — BH Assessment (Signed)
Spoke with patient to complete TTS consult.  He stated he is suicidal due to the many things that have been going on with him.  He denies having a plan  He is currently homeless [place] the [place].  He reported he had 2 24 oz beers earlier.  His BAC level was 230 when he arrived.  He denied any other substance use.  The UDA has a positive result for cocaine.  He was very drowsy during the assessment and was falling asleep during the assessment.  When he did awake his speech was slurred and it was difficult to understand the patient.  The patient will be assessed when he is more alert.

## 2017-02-25 NOTE — ED Notes (Signed)
Pt. Verbalizes understanding of d/c instructions and follow-up. VS stable and pt denies pain.  Pt. In NAD at time of d/c and denies further concerns regarding this visit. Pt. Stable at the time of departure from the unit, departing unit by the safest and most appropriate manner per that pt condition and limitations. Pt advised to return to the ED at any time for emergent concerns, or for new/worsening symptoms.   

## 2017-12-27 ENCOUNTER — Encounter: Payer: Self-pay | Admitting: Emergency Medicine

## 2017-12-27 ENCOUNTER — Emergency Department: Payer: BLUE CROSS/BLUE SHIELD

## 2017-12-27 ENCOUNTER — Other Ambulatory Visit: Payer: Self-pay

## 2017-12-27 ENCOUNTER — Emergency Department
Admission: EM | Admit: 2017-12-27 | Discharge: 2017-12-27 | Disposition: A | Discharge status: Home / Self Care | Payer: BLUE CROSS/BLUE SHIELD | Attending: Emergency Medicine | Admitting: Emergency Medicine

## 2017-12-27 DIAGNOSIS — Y929 Unspecified place or not applicable: Secondary | ICD-10-CM | POA: Insufficient documentation

## 2017-12-27 DIAGNOSIS — W2209XA Striking against other stationary object, initial encounter: Secondary | ICD-10-CM | POA: Diagnosis not present

## 2017-12-27 DIAGNOSIS — F17210 Nicotine dependence, cigarettes, uncomplicated: Secondary | ICD-10-CM | POA: Diagnosis not present

## 2017-12-27 DIAGNOSIS — S6991XA Unspecified injury of right wrist, hand and finger(s), initial encounter: Secondary | ICD-10-CM

## 2017-12-27 DIAGNOSIS — Y999 Unspecified external cause status: Secondary | ICD-10-CM | POA: Diagnosis not present

## 2017-12-27 DIAGNOSIS — M79641 Pain in right hand: Secondary | ICD-10-CM | POA: Diagnosis not present

## 2017-12-27 DIAGNOSIS — Y939 Activity, unspecified: Secondary | ICD-10-CM | POA: Insufficient documentation

## 2017-12-27 MED ORDER — KETOROLAC TROMETHAMINE 10 MG PO TABS: 10.0000 mg | ORAL_TABLET | Freq: Four times a day (QID) | ORAL | 0 refills | Status: DC | PRN

## 2017-12-27 MED ORDER — KETOROLAC TROMETHAMINE 30 MG/ML IJ SOLN
30.0000 mg | Freq: Once | INTRAMUSCULAR | Status: AC
  Administered 2017-12-27: 30 mg via INTRAMUSCULAR
  Filled 2017-12-27: qty 1

## 2017-12-27 NOTE — ED Notes (Addendum)
Pt was informed that he needed to wait 20 min and then would be discharged - immediately after receiving injection the patient left the room - will wait 5 min to see if pt returns
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
## 2017-12-27 NOTE — ED Notes (Signed)
Pt returned to room - provider aware
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
## 2018-07-07 ENCOUNTER — Other Ambulatory Visit: Payer: Self-pay

## 2018-07-07 ENCOUNTER — Emergency Department
Admission: EM | Admit: 2018-07-07 | Discharge: 2018-07-07 | Disposition: A | Discharge status: Home / Self Care | Payer: BLUE CROSS/BLUE SHIELD | Attending: Student in an Organized Health Care Education/Training Program | Admitting: Student in an Organized Health Care Education/Training Program

## 2018-07-07 ENCOUNTER — Encounter: Payer: Self-pay | Admitting: Emergency Medicine

## 2018-07-07 DIAGNOSIS — W1839XA Other fall on same level, initial encounter: Secondary | ICD-10-CM | POA: Insufficient documentation

## 2018-07-07 DIAGNOSIS — Y939 Activity, unspecified: Secondary | ICD-10-CM | POA: Insufficient documentation

## 2018-07-07 DIAGNOSIS — Y999 Unspecified external cause status: Secondary | ICD-10-CM | POA: Insufficient documentation

## 2018-07-07 DIAGNOSIS — F17210 Nicotine dependence, cigarettes, uncomplicated: Secondary | ICD-10-CM | POA: Insufficient documentation

## 2018-07-07 DIAGNOSIS — S61511A Laceration without foreign body of right wrist, initial encounter: Secondary | ICD-10-CM

## 2018-07-07 DIAGNOSIS — Y929 Unspecified place or not applicable: Secondary | ICD-10-CM | POA: Insufficient documentation

## 2018-07-07 MED ORDER — LIDOCAINE HCL (PF) 1 % IJ SOLN
10.0000 mL | Freq: Once | INTRAMUSCULAR | Status: AC
  Administered 2018-07-07: 10 mL
  Filled 2018-07-07: qty 10
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
## 2018-07-14 ENCOUNTER — Emergency Department
Admission: EM | Admit: 2018-07-14 | Discharge: 2018-07-14 | Disposition: A | Discharge status: Home / Self Care | Payer: Self-pay | Attending: Emergency Medicine | Admitting: Emergency Medicine

## 2018-07-14 ENCOUNTER — Other Ambulatory Visit: Payer: Self-pay

## 2018-07-14 DIAGNOSIS — W25XXXD Contact with sharp glass, subsequent encounter: Secondary | ICD-10-CM | POA: Insufficient documentation

## 2018-07-14 DIAGNOSIS — Z4802 Encounter for removal of sutures: Secondary | ICD-10-CM

## 2018-07-14 DIAGNOSIS — F17210 Nicotine dependence, cigarettes, uncomplicated: Secondary | ICD-10-CM | POA: Insufficient documentation

## 2018-07-14 DIAGNOSIS — S61511D Laceration without foreign body of right wrist, subsequent encounter: Secondary | ICD-10-CM | POA: Insufficient documentation

## 2018-07-14 MED ORDER — BACITRACIN ZINC 500 UNIT/GM EX OINT: TOPICAL_OINTMENT | Freq: Two times a day (BID) | CUTANEOUS | Status: DC

## 2018-07-14 MED ORDER — BACITRACIN ZINC 500 UNIT/GM EX OINT
TOPICAL_OINTMENT | Freq: Once | CUTANEOUS | Status: DC
  Filled 2018-07-14: qty 0.9
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
## 2018-07-14 NOTE — ED Notes (Signed)
Pt here for suture removal. Sutures removed by PA.

## 2018-07-14 NOTE — ED Notes (Signed)
Pt left prior to discharge review. No signature obtained.
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
## 2018-07-14 NOTE — ED Notes (Signed)
Pt left prior to discharge paper review. RN waiting for medication from pharmacy. Pt left prior to receiving medication and RN unable to obtain signature for discharge.

## 2018-07-30 ENCOUNTER — Encounter: Payer: Self-pay | Admitting: Emergency Medicine

## 2018-07-30 ENCOUNTER — Other Ambulatory Visit: Payer: Self-pay

## 2018-07-30 DIAGNOSIS — K2920 Alcoholic gastritis without bleeding: Secondary | ICD-10-CM | POA: Insufficient documentation

## 2018-07-30 DIAGNOSIS — F10920 Alcohol use, unspecified with intoxication, uncomplicated: Secondary | ICD-10-CM | POA: Insufficient documentation

## 2018-07-30 DIAGNOSIS — F17210 Nicotine dependence, cigarettes, uncomplicated: Secondary | ICD-10-CM | POA: Insufficient documentation

## 2018-07-30 LAB — URINALYSIS, COMPLETE (UACMP) WITH MICROSCOPIC
Bacteria, UA: NONE SEEN
Bilirubin Urine: NEGATIVE
Glucose, UA: NEGATIVE mg/dL
Ketones, ur: 5 mg/dL — AB
Leukocytes, UA: NEGATIVE
Nitrite: NEGATIVE
Protein, ur: 30 mg/dL — AB
Specific Gravity, Urine: 1.018 (ref 1.005–1.030)
pH: 5 (ref 5.0–8.0)

## 2018-07-30 LAB — CBC
HCT: 48.4 % (ref 39.0–52.0)
Hemoglobin: 16.2 g/dL (ref 13.0–17.0)
MCH: 29.9 pg (ref 26.0–34.0)
MCHC: 33.5 g/dL (ref 30.0–36.0)
MCV: 89.3 fL (ref 80.0–100.0)
Platelets: 346 10*3/uL (ref 150–400)
RBC: 5.42 MIL/uL (ref 4.22–5.81)
RDW: 14.9 % (ref 11.5–15.5)
WBC: 8.7 10*3/uL (ref 4.0–10.5)
nRBC: 0 % (ref 0.0–0.2)

## 2018-07-30 LAB — COMPREHENSIVE METABOLIC PANEL
ALT: 25 U/L (ref 0–44)
AST: 26 U/L (ref 15–41)
Albumin: 4.9 g/dL (ref 3.5–5.0)
Alkaline Phosphatase: 78 U/L (ref 38–126)
Anion gap: 13 (ref 5–15)
BUN: 12 mg/dL (ref 6–20)
CO2: 24 mmol/L (ref 22–32)
Calcium: 9 mg/dL (ref 8.9–10.3)
Chloride: 104 mmol/L (ref 98–111)
Creatinine, Ser: 0.7 mg/dL (ref 0.61–1.24)
GFR calc Af Amer: >60 mL/min (ref >60)
GFR calc non Af Amer: >60 mL/min (ref >60)
Glucose, Bld: 93 mg/dL (ref 70–99)
Potassium: 3.8 mmol/L (ref 3.5–5.1)
Sodium: 141 mmol/L (ref 135–145)
Total Bilirubin: 0.7 mg/dL (ref 0.3–1.2)
Total Protein: 8.8 g/dL — ABNORMAL HIGH (ref 6.5–8.1)

## 2018-07-30 LAB — LIPASE, BLOOD: Lipase: 26 U/L (ref 11–51)

## 2018-07-30 MED ORDER — SODIUM CHLORIDE 0.9% FLUSH
3.0000 mL | Freq: Once | INTRAVENOUS | Status: AC
  Administered 2018-07-31: 3 mL via INTRAVENOUS

## 2018-07-30 NOTE — ED Triage Notes (Signed)
Pt arrives via [facility] with c/o abdominal pain. Pt is ambulatory to triage and is in NAD.

## 2018-07-30 NOTE — ED Notes (Signed)
Patient ambulatory to waiting room by EMS for epigastric pain.  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  Reports total of 4 40oz beers today.  BP 154/86; hr 114, 98% pulse oxi on room air.

## 2018-07-31 ENCOUNTER — Emergency Department
Admission: EM | Admit: 2018-07-31 | Discharge: 2018-07-31 | Disposition: A | Discharge status: Home / Self Care | Payer: Self-pay | Attending: Emergency Medicine | Admitting: Emergency Medicine

## 2018-07-31 DIAGNOSIS — R1013 Epigastric pain: Secondary | ICD-10-CM

## 2018-07-31 DIAGNOSIS — K2920 Alcoholic gastritis without bleeding: Secondary | ICD-10-CM

## 2018-07-31 DIAGNOSIS — F10920 Alcohol use, unspecified with intoxication, uncomplicated: Secondary | ICD-10-CM

## 2018-07-31 LAB — ETHANOL: Alcohol, Ethyl (B): 308 mg/dL (ref <10)

## 2018-07-31 MED ORDER — THIAMINE HCL 100 MG/ML IJ SOLN
Freq: Once | INTRAVENOUS | Status: AC
  Administered 2018-07-31: 03:00:00 via INTRAVENOUS
  Filled 2018-07-31: qty 1000

## 2018-07-31 MED ORDER — FAMOTIDINE IN NACL 20-0.9 MG/50ML-% IV SOLN
20.0000 mg | Freq: Once | INTRAVENOUS | Status: AC
  Administered 2018-07-31: 20 mg via INTRAVENOUS
  Filled 2018-07-31: qty 50

## 2018-07-31 NOTE — BH Assessment (Signed)
[facility] Molly Maduro - Intake RN) called this morning to confirm they are in route to pick-up pt for detox treatment.

## 2018-07-31 NOTE — Discharge Instructions (Signed)
Proceed directly to [facility].  Return to the ER for worsening symptoms, persistent vomiting, difficulty breathing or other concerns. °

## 2018-07-31 NOTE — ED Notes (Signed)
Dr. Dolores Frame made aware of ethanol of 308

## 2018-07-31 NOTE — ED Notes (Signed)
Waiting on [facility] to pick up. Brought ginger ale. No other needs. Alert and oriented. NAD. unlabored.

## 2018-07-31 NOTE — ED Provider Notes (Signed)
[facility] Emergency Department Provider Note   ____________________________________________   First MD Initiated Contact with Patient 07/31/18 [phone]     (approximate)  I have reviewed the triage vital signs and the nursing notes.   HISTORY  Chief Complaint Abdominal Pain    HPI Andrew Long is a 38 y.o. male brought to the ED via EMS with a chief complaint of epigastric abdominal pain.  Patient reports a 1 day history of epigastric abdominal pain without associated nausea, vomiting or diarrhea.  Admits to drinking four 40 ounce beers.  Also desires detox now.  Denies history of DTs.  Denies recent fever, chills, chest pain, shortness of breath.  Denies recent travel or trauma.   Past Medical History:  Diagnosis Date  . Patient denies medical problems 12/05/14   denies    There are no active problems to display for this patient.   Past Surgical History:  Procedure Laterality Date  . denies     denies surgeries    Prior to Admission medications   Not on File    Allergies Tramadol  Family History  Problem Relation Age of Onset  . Heart failure Mother     Social History Social History   Tobacco Use  . Smoking status: Current Every Day Smoker    Packs/day: 0.30    Types: Cigarettes  . Smokeless tobacco: Never Used  Substance Use Topics  . Alcohol use: Yes    Alcohol/week: 28.0 standard drinks    Types: 14 Cans of beer, 14 Standard drinks or equivalent per week    Comment: pt states "a lot. whatever I can get."  . Drug use: Not Currently    Types: Cocaine    Comment: not currently    Review of Systems  Constitutional: No fever/chills Eyes: No visual changes. ENT: No sore throat. Cardiovascular: Denies chest pain. Respiratory: Denies shortness of breath. Gastrointestinal: Positive for abdominal pain.  No nausea, no vomiting.  No diarrhea.  No constipation. Genitourinary: Negative for dysuria. Musculoskeletal: Negative  for back pain. Skin: Negative for rash. Neurological: Negative for headaches, focal weakness or numbness. Psychiatric:Desires alcohol detox.  ____________________________________________   PHYSICAL EXAM:  VITAL SIGNS: ED Triage Vitals  Enc Vitals Group     BP 07/30/18 2256 (!) 140/100     Pulse Rate 07/30/18 2256 (!) 105     Resp 07/30/18 2256 18     Temp 07/30/18 2256 97.7 F (36.5 C)     Temp Source 07/30/18 2256 Oral     SpO2 07/30/18 2256 98 %     Weight 07/30/18 2254 147 lb (66.7 kg)     Height 07/30/18 2254 5\' 7"  (1.702 m)     Head Circumference --      Peak Flow --      Pain Score 07/30/18 2254 7     Pain Loc --      Pain Edu? --      Excl. in GC? --     Constitutional: Asleep, awakened for exam.  Alert and oriented. Well appearing and in no acute distress. Eyes: Conjunctivae are normal. PERRL. EOMI. Head: Atraumatic. Nose: No congestion/rhinnorhea. Mouth/Throat: Mucous membranes are moist.  Oropharynx non-erythematous. Neck: No stridor.   Cardiovascular: Normal rate, regular rhythm. Grossly normal heart sounds.  Good peripheral circulation. Respiratory: Normal respiratory effort.  No retractions. Lungs CTAB. Gastrointestinal: Soft and mildly tender to palpation epigastrium without rebound or guarding. No distention. No abdominal bruits. No CVA tenderness. Musculoskeletal: No lower  extremity tenderness nor edema.  No joint effusions. Neurologic:  Normal speech and language. No gross focal neurologic deficits are appreciated. No gait instability. Skin:  Skin is warm, dry and intact. No rash noted. Psychiatric: Mood and affect are normal. Speech and behavior are normal.  ____________________________________________   LABS (all labs ordered are listed, but only abnormal results are displayed)  Labs Reviewed  COMPREHENSIVE METABOLIC PANEL - Abnormal; Notable for the following components:      Result Value   Total Protein 8.8 (*)    All other components within  normal limits  URINALYSIS, COMPLETE (UACMP) WITH MICROSCOPIC - Abnormal; Notable for the following components:   Color, Urine YELLOW (*)    APPearance CLEAR (*)    Hgb urine dipstick SMALL (*)    Ketones, ur 5 (*)    Protein, ur 30 (*)    All other components within normal limits  ETHANOL - Abnormal; Notable for the following components:   Alcohol, Ethyl (B) 308 (*)    All other components within normal limits  LIPASE, BLOOD  CBC   ____________________________________________  EKG  None ____________________________________________  RADIOLOGY  ED MD interpretation: None  Official radiology report(s): No results found.  ____________________________________________   PROCEDURES  Procedure(s) performed: None  Procedures  Critical Care performed: No  ____________________________________________   INITIAL IMPRESSION / ASSESSMENT AND PLAN / ED COURSE  As part of my medical decision making, I reviewed the following data within the electronic MEDICAL RECORD NUMBER Nursing notes reviewed and incorporated, Labs reviewed, Old chart reviewed and Notes from prior ED visits   38 year old male who presents with epigastric pain in the setting of recent alcohol use. Differential diagnosis includes, but is not limited to, biliary disease (biliary colic, acute cholecystitis, cholangitis, choledocholithiasis, etc), intrathoracic causes for epigastric abdominal pain including ACS, gastritis, duodenitis, pancreatitis, small bowel or large bowel obstruction, abdominal aortic aneurysm, hernia, and ulcer(s).  Laboratory results unremarkable.  Will check EtOH level.  Initiate IV fluid resuscitation, 20 mg IV Pepcid for alcoholic gastritis.  Patient already asking for food and drink.  Asked him to hold off until evaluation is completed.  Clinical Course as of Jul 31 705  Mon Jul 31, 2018  0310 [facility] has evaluated the patient.  He has been accepted to [facility] and will be transported in the morning.    [JS]  0706 No further events.  Plan is still for patient to go to [facility] later this morning.  Care transferred to Dr. Derrill Kay.   [JS]    Clinical Course User Index [JS] Irean Hong, MD     ____________________________________________   FINAL CLINICAL IMPRESSION(S) / ED DIAGNOSES  Final diagnoses:  Epigastric pain  Acute alcoholic gastritis without hemorrhage  Alcoholic intoxication without complication [facility])     ED Discharge Orders    None       Note:  This document was prepared using Dragon voice recognition software and may include unintentional dictation errors.    Irean Hong, MD 07/31/18 [phone]

## 2018-07-31 NOTE — ED Notes (Signed)
Dawn RN in to pt to explain that this RN was in with a critical pt.

## 2018-07-31 NOTE — ED Notes (Signed)
TTS spoke with Andrew Long at [facility].  He reports that he is holding the last male bed for Mr. Andrew Long.  Andrew Long received all the pre-admit information, but is waiting for the morning nurse before final acceptance is given.  Mr. Andrew Long reported at a BAC = 308.  BAC must be under 140 before he can be admitted to [facility].

## 2018-09-12 ENCOUNTER — Ambulatory Visit: Payer: Self-pay

## 2018-09-21 ENCOUNTER — Ambulatory Visit: Payer: Self-pay | Admitting: Urology

## 2018-09-21 ENCOUNTER — Other Ambulatory Visit: Payer: Self-pay

## 2018-09-21 VITALS — BP 119/73 | HR 89 | Temp 98.2°F | Ht 71.0 in | Wt 148.1 lb

## 2018-09-21 DIAGNOSIS — L729 Follicular cyst of the skin and subcutaneous tissue, unspecified: Secondary | ICD-10-CM

## 2018-09-21 DIAGNOSIS — Z0000 Encounter for general adult medical examination without abnormal findings: Secondary | ICD-10-CM

## 2018-09-21 NOTE — Progress Notes (Signed)
  Patient: Andrew Long Male    DOB: 04-02-1981   39 y.o.   MRN: [id number] Visit Date: 09/21/2018  Today's Provider: Michiel Cowboy, PA-C   Chief Complaint  Patient presents with  . Annual Exam    physical and eye exam   Subjective:    HPI Lump on the left temple x two years- no pain - no drainage - has an odor   "can't see" he has no far vision x one year - never had glasses- no pain- no blind spots   Mother with diabetes.  Father with HTN.  Unsure about siblings.       Allergies  Allergen Reactions  . Tramadol Nausea And Vomiting   Previous Medications   CITALOPRAM (CELEXA) 20 MG TABLET    Take 20 mg by mouth daily.    Review of Systems  All other systems reviewed and are negative.   Social History   Tobacco Use  . Smoking status: Current Every Day Smoker    Packs/day: 0.30    Types: Cigarettes  . Smokeless tobacco: Never Used  Substance Use Topics  . Alcohol use: Yes    Alcohol/week: 28.0 standard drinks    Types: 14 Cans of beer, 14 Standard drinks or equivalent per week    Comment: pt states "a lot. whatever I can get."   Objective:   BP 119/73 (BP Location: Right Arm)   Pulse 89   Temp 98.2 F (36.8 C)   Ht 5\' 11"  (1.803 m)   Wt 148 lb 1.6 oz (67.2 kg)   BMI 20.66 kg/m   Physical Exam Vitals signs reviewed.  Constitutional:      Appearance: Normal appearance. He is normal weight.  HENT:     Head: Normocephalic and atraumatic.     Comments: Soft mass (grape size) non tender no erythema no crepitus     Nose: Nose normal.     Mouth/Throat:     Mouth: Mucous membranes are moist.  Eyes:     Extraocular Movements: Extraocular movements intact.     Pupils: Pupils are equal, round, and reactive to light.  Neck:     Musculoskeletal: Normal range of motion and neck supple.  Cardiovascular:     Rate and Rhythm: Normal rate and regular rhythm.     Pulses: Normal pulses.     Heart sounds: Normal heart sounds.  Pulmonary:     Effort: Pulmonary  effort is normal.     Breath sounds: Normal breath sounds.  Abdominal:     General: Abdomen is flat.     Palpations: Abdomen is soft.  Musculoskeletal: Normal range of motion.  Skin:    General: Skin is warm and dry.  Neurological:     General: No focal deficit present.     Mental Status: He is alert and oriented to person, place, and time.  Psychiatric:        Mood and Affect: Mood normal.        Behavior: Behavior normal.        Thought Content: Thought content normal.        Judgment: Judgment normal.         Assessment & Plan:  1. Health maintenance - check TSH, lipids and Hbg A1c  2. Lipoma vs inclusion cyst - refer to general surgery         Michiel Cowboy, PA-C   [facility]

## 2018-09-22 LAB — LIPID PANEL
Chol/HDL Ratio: 3.8 ratio (ref 0.0–5.0)
Cholesterol, Total: 145 mg/dL (ref 100–199)
HDL: 38 mg/dL — ABNORMAL LOW (ref >39)
LDL Calculated: 62 mg/dL (ref 0–99)
Triglycerides: 226 mg/dL — ABNORMAL HIGH (ref 0–149)
VLDL Cholesterol Cal: 45 mg/dL — ABNORMAL HIGH (ref 5–40)

## 2018-09-22 LAB — TSH: TSH: 0.709 u[IU]/mL (ref 0.450–4.500)

## 2018-09-22 LAB — HEMOGLOBIN A1C
Est. average glucose Bld gHb Est-mCnc: 114 mg/dL
Hgb A1c MFr Bld: 5.6 % (ref 4.8–5.6)

## 2018-10-03 ENCOUNTER — Ambulatory Visit: Payer: Self-pay | Admitting: Surgery

## 2018-10-13 ENCOUNTER — Ambulatory Visit: Payer: Self-pay | Admitting: Pharmacy Technician

## 2018-10-13 ENCOUNTER — Other Ambulatory Visit: Payer: Self-pay

## 2018-10-13 DIAGNOSIS — Z79899 Other long term (current) drug therapy: Secondary | ICD-10-CM

## 2018-10-13 NOTE — Progress Notes (Signed)
Conducted phone consult.  Verbally read contract.  Patient verbally acknowledged that he understood and did not have questions about the contract.  Mailing contract to patient to sign and return to [facility].    Patient approved to receive medication assistance at [facility] as long as eligibility criteria continues to be met.  [facility]

## 2018-10-19 ENCOUNTER — Ambulatory Visit: Payer: Self-pay | Admitting: Adult Health Nurse Practitioner

## 2018-10-19 DIAGNOSIS — Z0000 Encounter for general adult medical examination without abnormal findings: Secondary | ICD-10-CM

## 2018-10-19 NOTE — Progress Notes (Signed)
  Patient: Andrew Long Male    DOB: December 30, 1980   38 y.o.   MRN: [id number] Visit Date: 10/19/2018  Today's Provider: [facility]   No chief complaint on file.  Subjective:    HPI   Telephonic visit.    Labs review.   Allergies  Allergen Reactions  . Tramadol Nausea And Vomiting   Previous Medications   CITALOPRAM (CELEXA) 20 MG TABLET    Take 20 mg by mouth daily.    Review of Systems  All other systems reviewed and are negative.   Social History   Tobacco Use  . Smoking status: Current Every Day Smoker    Packs/day: 0.30    Types: Cigarettes  . Smokeless tobacco: Never Used  Substance Use Topics  . Alcohol use: Yes    Alcohol/week: 28.0 standard drinks    Types: 14 Cans of beer, 14 Standard drinks or equivalent per week    Comment: pt states "a lot. whatever I can get."   Objective:   There were no vitals taken for this visit.  Physical Exam  No PE.     Assessment & Plan:        Labs stable.   FU as needed.  FU with general surgery as indicated.    [facility]   [facility]

## 2018-10-26 ENCOUNTER — Ambulatory Visit: Payer: Self-pay

## 2018-10-26 ENCOUNTER — Ambulatory Visit: Payer: Self-pay | Admitting: Ophthalmology

## 2018-10-27 ENCOUNTER — Other Ambulatory Visit: Payer: Self-pay

## 2018-10-27 ENCOUNTER — Encounter: Payer: Self-pay | Admitting: Surgery

## 2018-10-27 ENCOUNTER — Ambulatory Visit (INDEPENDENT_AMBULATORY_CARE_PROVIDER_SITE_OTHER): Payer: Self-pay | Admitting: Surgery

## 2018-10-27 VITALS — BP 116/78 | HR 71 | Temp 97.9°F | Ht 71.0 in | Wt 152.2 lb

## 2018-10-27 DIAGNOSIS — L729 Follicular cyst of the skin and subcutaneous tissue, unspecified: Secondary | ICD-10-CM | POA: Insufficient documentation

## 2018-10-27 NOTE — Patient Instructions (Addendum)
Patient will need to return to the office in 6 weeks to discuss surgery for removal of cyst near right eye.    Call the office with any questions or concerns.    Epidermal Cyst  An epidermal cyst is a small, painless lump under your skin. The cyst contains a grayish-white, bad-smelling substance (keratin). Do not try to pop or open an epidermal cyst yourself. What are the causes?  A blocked hair follicle.  A hair that curls and re-enters the skin instead of growing straight out of the skin.  A blocked pore.  Irritated skin.  An injury to the skin.  Certain conditions that are passed along from parent to child (inherited).  Human papillomavirus (HPV).  Long-term sun damage to the skin. What increases the risk?  Having acne.  Being overweight.  Being 36-36 years old. What are the signs or symptoms? These cysts are usually harmless, but they can get infected. Symptoms of infection may include:  Redness.  Inflammation.  Tenderness.  Warmth.  Fever.  A grayish-white, bad-smelling substance drains from the cyst.  Pus drains from the cyst. How is this treated? In many cases, epidermal cysts go away on their own without treatment. If a cyst becomes infected, treatment may include:  Opening and draining the cyst, done by a doctor. After draining, you may need minor surgery to remove the rest of the cyst.  Antibiotic medicine.  Shots of medicines (steroids) that help to reduce inflammation.  Surgery to remove the cyst. Surgery may be done if the cyst: ? Becomes large. ? Bothers you. ? Has a chance of turning into cancer.  Do not try to open a cyst yourself. Follow these instructions at home:  Take over-the-counter and prescription medicines only as told by your doctor.  If you were prescribed an antibiotic medicine, take it it as told by your doctor. Do not stop using the antibiotic even if you start to feel better.  Keep the area around your cyst clean and  dry.  Wear loose, dry clothing.  Avoid touching your cyst.  Check your cyst every day for signs of infection. Check for: ? Redness, swelling, or pain. ? Fluid or blood. ? Warmth. ? Pus or a bad smell.  Keep all follow-up visits as told by your doctor. This is important. How is this prevented?  Wear clean, dry, clothing.  Avoid wearing tight clothing.  Keep your skin clean and dry. Take showers or baths every day. Contact a doctor if:  Your cyst has symptoms of infection.  Your condition does not improve or gets worse.  You have a cyst that looks different from other cysts you have had.  You have a fever. Get help right away if:  Redness spreads from the cyst into the area close by. Summary  An epidermal cyst is a sac made of skin tissue.  If a cyst becomes infected, treatment may include surgery to open and drain the cyst, or to remove it.  Take over-the-counter and prescription medicines only as told by your doctor.  Contact a doctor if your condition is not improving or is getting worse.  Keep all follow-up visits as told by your doctor. This is important. This information is not intended to replace advice given to you by your health care provider. Make sure you discuss any questions you have with your health care provider. Document Released: 08/05/2004 Document Revised: 01/09/2018 Document Reviewed: 04/30/2015 Elsevier Interactive Patient Education  2019 [employer].

## 2018-10-27 NOTE — Progress Notes (Signed)
10/27/2018  Reason for Visit:  Scalp cyst  Referring Provider:  Michiel Cowboy, PA-C  History of Present Illness: Andrew Long is a 38 y.o. male referred for evaluation of a scalp cyst.  The patient reports he has had this for about 2-3 years and has been growing slowly in size.  He is asymptomatic from it, but describes it as an "eye sore" in that he does not like the aesthetics of it.  Otherwise, he denies any pain in his right eye or eyelid, any difficulty with vision on his right eye, any difficulty with eye, eyelid, or eyebrow motion, any tingliness or numbness.  He denies any fevers, chills, or any redness, swelling, or drainage from the mass.  He reports that sometimes it itches but otherwise has no other symptoms.  He denies any other cysts or lumps in his body.  Past Medical History: Past Medical History:  Diagnosis Date  . Allergy   . Depression 12/05/14        Past Surgical History: Past Surgical History:  Procedure Laterality Date  . denies     denies surgeries    Home Medications: Prior to Admission medications   Medication Sig Start Date End Date Taking? Authorizing Provider  citalopram (CELEXA) 20 MG tablet Take 20 mg by mouth daily.   Yes [provider]    Allergies: Allergies  Allergen Reactions  . Tramadol Nausea And Vomiting    Social History:  reports that he has been smoking cigarettes. He has been smoking about 0.30 packs per day. He has never used smokeless tobacco. He reports current alcohol use of about 28.0 standard drinks of alcohol per week. He reports previous drug use. Drug: Cocaine.    However, when I asked him about alcohol or drug use, he denied either one.  He does confirm that he smokes 3 cigarettes per day.  Family History: Family History  Problem Relation Age of Onset  . Heart failure Mother   . Diabetes Mother     Review of Systems: Review of Systems  Constitutional: Negative for chills and fever.  HENT: Negative  for hearing loss.   Eyes: Negative for blurred vision, double vision, pain, discharge and redness.  Respiratory: Negative for shortness of breath.   Cardiovascular: Negative for chest pain.  Gastrointestinal: Negative for abdominal pain, nausea and vomiting.  Genitourinary: Negative for dysuria.  Musculoskeletal: Negative for myalgias.  Skin: Negative for rash.  Neurological: Negative for dizziness.  Psychiatric/Behavioral: Negative for depression.    Physical Exam BP 116/78   Pulse 71   Temp 97.9 F (36.6 C) (Temporal)   Ht 5\' 11"  (1.803 m)   Wt 152 lb 3.2 oz (69 kg)   SpO2 98%   BMI 21.23 kg/m  CONSTITUTIONAL: No acute distress HEENT:  Normocephalic, atraumatic, extraocular motion intact.  He has a 2.5 cm superficial, mobile skin cyst overlying the area between the temporalis and orbicularis muscles lateral to the right eye.  There is no erythema, induration, tenderness, or drainage. NECK: Trachea is midline, and there is no jugular venous distension.  RESPIRATORY:  Lungs are clear, and breath sounds are equal bilaterally. Normal respiratory effort without pathologic use of accessory muscles. CARDIOVASCULAR: Heart is regular without murmurs, gallops, or rubs. GI: The abdomen is soft, nondistended, nontender.  MUSCULOSKELETAL:  Normal muscle strength and tone in all four extremities.  No peripheral edema or cyanosis. SKIN: Skin turgor is normal. There are no pathologic skin lesions.  NEUROLOGIC:  Motor and sensation is  grossly normal.  Cranial nerves are grossly intact. PSYCH:  Alert and oriented to person, place and time. Affect is normal.  Laboratory Analysis: Labs from 07/30/18 were overall unremarkable and normal.  He has had a previous positive urine toxicology screen for cocaine on 02/25/17.  Imaging: None  Assessment and Plan: This is a 38 y.o. male with a right temporal scalp cyst, likely sebaceous cyst.  Discussed with the patient that most likely this is a benign  mass, judging from its slow chronicity, mobility, and lack of symptoms.  He would like to have it excised.  Unfortunately, it is in a location that I would not feel comfortable to excise it in the office, and would rather do it in the operating room where he can be sedated and we have more control of the field and instruments and cautery.  He is in agreement.  Also discussed with him that given our current COVID-19 restrictions, I am unable to schedule him until at least June 1st.  I will have him follow up with me the first week of June to update H&P and schedule a date/time for surgery.  Did discuss with him that it would be an outpatient procedure and briefly the recovery process.  He is in agreement and will follow up in June.  Face-to-face time spent with the patient and care providers was 40 minutes, with more than 50% of the time spent counseling, educating, and coordinating care of the patient.     Howie IllJose Luis Jasemine Nawaz, MD [facility]

## 2018-11-20 ENCOUNTER — Telehealth: Payer: Self-pay | Admitting: *Deleted

## 2018-11-20 NOTE — Telephone Encounter (Signed)
Patient was requesting oxycodone for after the surgery not before.   I told patient he could further discuss this with Dr. Aleen Campi at his pre-op visit.   Patient verbalizes understanding.

## 2018-11-20 NOTE — Telephone Encounter (Signed)
Contacted patient today and notified him that elective surgeries are still pending at [facility] but gave the option to have done at the [facility].   Patient is agreeable.   Pre-op appointment moved up from 12-15-18 to 11-24-18.  Patient is also requesting oxycodone for the pain.   He uses Adult nurse on [address].   In basket message sent to Dr. Aleen Campi regarding the above.

## 2018-11-24 ENCOUNTER — Ambulatory Visit (INDEPENDENT_AMBULATORY_CARE_PROVIDER_SITE_OTHER): Payer: Self-pay | Admitting: Surgery

## 2018-11-24 ENCOUNTER — Encounter: Payer: Self-pay | Admitting: Surgery

## 2018-11-24 ENCOUNTER — Other Ambulatory Visit: Payer: Self-pay

## 2018-11-24 ENCOUNTER — Encounter: Payer: Self-pay | Admitting: *Deleted

## 2018-11-24 VITALS — BP 125/82 | HR 90 | Temp 97.9°F | Resp 16 | Ht 71.0 in | Wt 149.6 lb

## 2018-11-24 DIAGNOSIS — L729 Follicular cyst of the skin and subcutaneous tissue, unspecified: Secondary | ICD-10-CM

## 2018-11-24 NOTE — Progress Notes (Signed)
Patient's surgery to be scheduled for 12-25-18 at [facility] with Dr. Aleen Campi.  He is aware to have COVID-19 testing done at the Medical [address] [facility] between 00:51 am and 12:30 pm on 12-21-18.   The patient is aware he will be contacted by the Pre-Admission Testing Department to complete a phone interview sometime in the near future.  Patient aware to be NPO after midnight and have a driver.   He is aware to check in at the [facility] entrance where he will be screened for the coronavirus and then sent to Same Day Surgery.   Patient aware that he may have no visitors and driver will need to wait in the car due to COVID-19 restrictions.   The patient verbalizes understanding of the above.   The patient is aware to call the office should he have further questions.

## 2018-11-24 NOTE — Addendum Note (Signed)
Addended by: Myrtie Hawk on: 11/24/2018 02:34 PM   Modules accepted: Orders, SmartSet

## 2018-11-24 NOTE — Patient Instructions (Signed)
Our surgery scheduler will call you to schedule your surgery.

## 2018-11-24 NOTE — Progress Notes (Signed)
  11/24/2018  History of Present Illness: Andrew Long is a 38 y.o. male presents for H&P update for right scalp cyst.  He was last seen on 4/17 but could not be scheduled due to COVID restrictions.  The cyst is in the temporal area, near the right eyelid and eyebrow, and due to location, I would rather do this in the operating room instead of office.  Patient denies any worsening pain, enlarging of cyst, redness, or drainage, but he reports that it smells.  He is anxious and eager to get it excised.  Past Medical History: Past Medical History:  Diagnosis Date  . Allergy   . Patient denies medical problems 12/05/14   denies     Past Surgical History: Past Surgical History:  Procedure Laterality Date  . denies     denies surgeries    Home Medications: Prior to Admission medications   Medication Sig Start Date End Date Taking? Authorizing Provider  citalopram (CELEXA) 20 MG tablet Take 20 mg by mouth daily.   Yes [provider]    Allergies: Allergies  Allergen Reactions  . Tramadol Nausea And Vomiting    Review of Systems: Review of Systems  Constitutional: Negative for chills and fever.  Eyes: Negative for blurred vision, pain and discharge.  Respiratory: Negative for shortness of breath.   Cardiovascular: Negative for chest pain.  Gastrointestinal: Negative for abdominal pain, nausea and vomiting.  Skin: Negative for rash.    Physical Exam BP 125/82   Pulse 90   Temp 97.9 F (36.6 C) (Temporal)   Resp 16   Ht 5\' 11"  (1.803 m)   Wt 149 lb 9.6 oz (67.9 kg)   SpO2 98%   BMI 20.86 kg/m  CONSTITUTIONAL: No acute distress HEENT:  Normocephalic, atraumatic, extraocular motion intact.   RESPIRATORY:  Lungs are clear, and breath sounds are equal bilaterally. Normal respiratory effort without pathologic use of accessory muscles. CARDIOVASCULAR: Heart is regular without murmurs, gallops, or rubs. GI: The abdomen is soft, non-distended, non-tender. SKIN:   He  has a 2.5 cm superficial, mobile skin cyst overlying the area between the temporalis and orbicularis muscles lateral to the right eye.  There is no erythema, induration, tenderness, or drainage.  NEUROLOGIC:  Motor and sensation is grossly normal.  Cranial nerves are grossly intact. PSYCH:  Alert and oriented to person, place and time. Affect is normal.  Labs/Imaging: None recently  Assessment and Plan: This is a 38 y.o. male with a right scalp cyst.  Discussed with him that with COVID restrictions, the OR is just now starting to open up.  We will schedule him for June 15th that the OR will be at 100% capacity again.  Plan for excision of right scalp cyst under general anesthesia.  Discussed the risks of bleeding, infection, and injury to surrounding structures.  Discussed post-op medications and prescriptions, outcomes, and expectations.  Will need preop covid testing.  Orders are in and all of his questions have been answered.  Face-to-face time spent with the patient and care providers was 25 minutes, with more than 50% of the time spent counseling, educating, and coordinating care of the patient.     Howie Ill, MD [facility]

## 2018-11-27 ENCOUNTER — Encounter: Payer: Self-pay | Admitting: *Deleted

## 2018-12-06 ENCOUNTER — Other Ambulatory Visit: Payer: Self-pay

## 2018-12-06 ENCOUNTER — Ambulatory Visit: Payer: Self-pay | Admitting: Gerontology

## 2018-12-06 ENCOUNTER — Encounter: Payer: Self-pay | Admitting: Gerontology

## 2018-12-06 DIAGNOSIS — R1033 Periumbilical pain: Secondary | ICD-10-CM

## 2018-12-06 DIAGNOSIS — R140 Abdominal distension (gaseous): Secondary | ICD-10-CM

## 2018-12-06 DIAGNOSIS — K5900 Constipation, unspecified: Secondary | ICD-10-CM

## 2018-12-06 MED ORDER — POLYETHYLENE GLYCOL 3350 17 G PO PACK: 17.0000 g | PACK | Freq: Every day | ORAL | Status: DC

## 2018-12-06 MED ORDER — SIMETHICONE 80 MG PO CHEW: 80.0000 mg | CHEWABLE_TABLET | Freq: Four times a day (QID) | ORAL | 0 refills | Status: DC | PRN

## 2018-12-06 NOTE — Progress Notes (Signed)
Established Patient Office Visit  Subjective:  Patient ID: Andrew Long, male    DOB: [birth year]  Age: 38 y.o. MRN: [id number]  CC:  Chief Complaint  Patient presents with  . Follow-up    stomach pains ongoing for 3+ weeks  Patient consents to telephone visit and two patient identifiers was used to identify patient.  HPI Andrew Long presents for c/o intermittent non radiating 9/10 gnawing pain to peri umbilical abdominal area. He states that his abdominal pain has being going on for one month. He reports that pain starts in the morning and resolves after lunch everyday. He denies sick contacts, trauma or any aggravating factor. He states that eating food does not relieve pain and he has tried Maalox and tylenol with minimal relief. He reports that it hurts when he breaths, and the peri umbilical area is sore when touched,and sometimes he feels bloated and has flatus.  He states that he consumes small amount of food, but he drinks moderate amount of water and juice.He denies abdominal distension, acid reflux, nausea, vomiting, diarrhea, and he states that he moves his bowel 2 times a week which is normal for him. His last bowel movement was 2 days ago. He states that he consumed alcoholic beverage 027 days ago. He denies fever, chills, chest pain and no further concerns.   Past Medical History:  Diagnosis Date  . Allergy   . Patient denies medical problems 12/05/14   denies    Past Surgical History:  Procedure Laterality Date  . denies     denies surgeries    Family History  Problem Relation Age of Onset  . Heart failure Mother   . Diabetes Mother     Social History   Socioeconomic History  . Marital status: Single    Spouse name: Not on file  . Number of children: 2  . Years of education: Not on file  . Highest education level: Not on file  Occupational History  . Not on file  Social Needs  . Financial resource strain: Not on file  . Food insecurity:    Worry:  Not on file    Inability: Not on file  . Transportation needs:    Medical: Not on file    Non-medical: Not on file  Tobacco Use  . Smoking status: Current Every Day Smoker    Packs/day: 0.30    Types: Cigarettes  . Smokeless tobacco: Never Used  Substance and Sexual Activity  . Alcohol use: Not Currently    Alcohol/week: 28.0 standard drinks    Types: 14 Cans of beer, 14 Standard drinks or equivalent per week    Comment: pt states "a lot. whatever I can get."  . Drug use: Not Currently    Types: Cocaine    Comment: not currently  . Sexual activity: Yes    Birth control/protection: Condom  Lifestyle  . Physical activity:    Days per week: Not on file    Minutes per session: Not on file  . Stress: Not on file  Relationships  . Social connections:    Talks on phone: Not on file    Gets together: Not on file    Attends religious service: Not on file    Active member of club or organization: Not on file    Attends meetings of clubs or organizations: Not on file    Relationship status: Not on file  . Intimate partner violence:    Fear of current or ex  partner: Not on file    Emotionally abused: Not on file    Physically abused: Not on file    Forced sexual activity: Not on file  Other Topics Concern  . Not on file  Social History Narrative  . Not on file    Outpatient Medications Prior to Visit  Medication Sig Dispense Refill  . citalopram (CELEXA) 20 MG tablet Take 20 mg by mouth daily.     No facility-administered medications prior to visit.     Allergies  Allergen Reactions  . Tramadol Nausea And Vomiting    ROS Review of Systems  Constitutional: Negative.   HENT: Negative.   Respiratory: Negative.   Cardiovascular: Negative.   Gastrointestinal: Positive for abdominal pain (peri umbilical pain) and constipation. Negative for abdominal distention, anal bleeding, blood in stool, diarrhea, nausea, rectal pain and vomiting.  Genitourinary: Negative.   Skin:  Negative.   Neurological: Negative.   Psychiatric/Behavioral: Negative.       Objective:    Physical Exam No vital sign and PE done There were no vitals taken for this visit. Wt Readings from Last 3 Encounters:  11/24/18 149 lb 9.6 oz (67.9 kg)  10/27/18 152 lb 3.2 oz (69 kg)  09/21/18 148 lb 1.6 oz (67.2 kg)     Health Maintenance Due  Topic Date Due  . HIV Screening  04/23/1998  . TETANUS/TDAP  04/23/2002    There are no preventive care reminders to display for this patient.  Lab Results  Component Value Date   TSH 0.709 09/21/2018   Lab Results  Component Value Date   WBC 8.7 07/30/2018   HGB 16.2 07/30/2018   HCT 48.4 07/30/2018   MCV 89.3 07/30/2018   PLT 346 07/30/2018   Lab Results  Component Value Date   NA 141 07/30/2018   K 3.8 07/30/2018   CO2 24 07/30/2018   GLUCOSE 93 07/30/2018   BUN 12 07/30/2018   CREATININE 0.70 07/30/2018   BILITOT 0.7 07/30/2018   ALKPHOS 78 07/30/2018   AST 26 07/30/2018   ALT 25 07/30/2018   PROT 8.8 (H) 07/30/2018   ALBUMIN 4.9 07/30/2018   CALCIUM 9.0 07/30/2018   ANIONGAP 13 07/30/2018   Lab Results  Component Value Date   CHOL 145 09/21/2018   Lab Results  Component Value Date   HDL 38 (L) 09/21/2018   Lab Results  Component Value Date   LDLCALC 62 09/21/2018   Lab Results  Component Value Date   TRIG 226 (H) 09/21/2018   Lab Results  Component Value Date   CHOLHDL 3.8 09/21/2018   Lab Results  Component Value Date   HGBA1C 5.6 09/21/2018      Assessment & Plan:    1. Periumbilical abdominal pain - He was encouraged to complete charity care application for [facility] - CBC w/Diff; Future - Comp Met (CMET); Future - Urinalysis; Future - DG Abd 1 View; Future  2. Constipation, unspecified constipation type - He was educated on medication side effects. - polyethylene glycol (MIRALAX / GLYCOLAX) packet 17 g  3. Abdominal bloating - He was educated on medication side effects. - simethicone  (GAS-X) 80 MG chewable tablet; Chew 1 tablet (80 mg total) by mouth every 6 (six) hours as needed for flatulence.  Dispense: 30 tablet; Refill: 0    Follow-up: Return in about 6 days (around 12/12/2018), or if symptoms worsen or fail to improve.    [facility] Jerold Coombe, NP

## 2018-12-07 LAB — CBC WITH DIFFERENTIAL/PLATELET
Basophils Absolute: 0.1 10*3/uL (ref 0.0–0.2)
Basos: 1 %
EOS (ABSOLUTE): 0.2 10*3/uL (ref 0.0–0.4)
Eos: 3 %
Hematocrit: 45.7 % (ref 37.5–51.0)
Hemoglobin: 15.3 g/dL (ref 13.0–17.7)
Immature Grans (Abs): 0 10*3/uL (ref 0.0–0.1)
Immature Granulocytes: 0 %
Lymphocytes Absolute: 2.1 10*3/uL (ref 0.7–3.1)
Lymphs: 39 %
MCH: 29.1 pg (ref 26.6–33.0)
MCHC: 33.5 g/dL (ref 31.5–35.7)
MCV: 87 fL (ref 79–97)
Monocytes Absolute: 0.5 10*3/uL (ref 0.1–0.9)
Monocytes: 10 %
Neutrophils Absolute: 2.5 10*3/uL (ref 1.4–7.0)
Neutrophils: 47 %
Platelets: 235 10*3/uL (ref 150–450)
RBC: 5.25 x10E6/uL (ref 4.14–5.80)
RDW: 13.6 % (ref 11.6–15.4)
WBC: 5.4 10*3/uL (ref 3.4–10.8)

## 2018-12-07 LAB — COMPREHENSIVE METABOLIC PANEL
ALT: 9 IU/L (ref 0–44)
AST: 22 IU/L (ref 0–40)
Albumin/Globulin Ratio: 2.2 (ref 1.2–2.2)
Albumin: 4.8 g/dL (ref 4.0–5.0)
Alkaline Phosphatase: 66 IU/L (ref 39–117)
BUN/Creatinine Ratio: 8 — ABNORMAL LOW (ref 9–20)
BUN: 8 mg/dL (ref 6–20)
Bilirubin Total: 1.3 mg/dL — ABNORMAL HIGH (ref 0.0–1.2)
CO2: 22 mmol/L (ref 20–29)
Calcium: 9.8 mg/dL (ref 8.7–10.2)
Chloride: 100 mmol/L (ref 96–106)
Creatinine, Ser: 0.97 mg/dL (ref 0.76–1.27)
GFR calc Af Amer: 116 mL/min/{1.73_m2} (ref >59)
GFR calc non Af Amer: 101 mL/min/{1.73_m2} (ref >59)
Globulin, Total: 2.2 g/dL (ref 1.5–4.5)
Glucose: 77 mg/dL (ref 65–99)
Potassium: 4.7 mmol/L (ref 3.5–5.2)
Sodium: 142 mmol/L (ref 134–144)
Total Protein: 7 g/dL (ref 6.0–8.5)

## 2018-12-07 LAB — URINALYSIS
Bilirubin, UA: NEGATIVE
Glucose, UA: NEGATIVE
Ketones, UA: NEGATIVE
Leukocytes,UA: NEGATIVE
Nitrite, UA: NEGATIVE
Protein,UA: NEGATIVE
RBC, UA: NEGATIVE
Specific Gravity, UA: 1.017 (ref 1.005–1.030)
Urobilinogen, Ur: 1 mg/dL (ref 0.2–1.0)
pH, UA: 8 — ABNORMAL HIGH (ref 5.0–7.5)

## 2018-12-08 ENCOUNTER — Other Ambulatory Visit: Payer: Self-pay

## 2018-12-08 ENCOUNTER — Other Ambulatory Visit: Payer: Self-pay | Admitting: Gerontology

## 2018-12-08 DIAGNOSIS — K5900 Constipation, unspecified: Secondary | ICD-10-CM

## 2018-12-08 MED ORDER — POLYETHYLENE GLYCOL 3350 17 G PO PACK: 17.0000 g | PACK | Freq: Every day | ORAL | Status: DC

## 2018-12-12 ENCOUNTER — Ambulatory Visit: Payer: Self-pay | Admitting: Gerontology

## 2018-12-15 ENCOUNTER — Ambulatory Visit: Payer: Self-pay | Admitting: Surgery

## 2018-12-19 ENCOUNTER — Telehealth: Payer: Self-pay | Admitting: *Deleted

## 2018-12-19 ENCOUNTER — Encounter
Admission: RE | Admit: 2018-12-19 | Discharge: 2018-12-19 | Disposition: A | Discharge status: Home / Self Care | Payer: Self-pay | Source: Ambulatory Visit | Attending: Surgery | Admitting: Surgery

## 2018-12-19 ENCOUNTER — Other Ambulatory Visit: Payer: Self-pay

## 2018-12-19 HISTORY — DX: Depression, unspecified: F32.A

## 2018-12-19 NOTE — Patient Instructions (Signed)
Your procedure is scheduled on: Monday 12/25/18 Report to [facility]. To find out your arrival time please call [phone] between 1PM - 3PM on Friday 12/22/18.  Remember: Instructions that are not followed completely may result in serious medical risk, up to and including death, or upon the discretion of your surgeon and anesthesiologist your surgery may need to be rescheduled.     _X__ 1. Do not eat food after midnight the night before your procedure.                 No gum chewing or hard candies. You may drink clear liquids up to 2 hours                 before you are scheduled to arrive for your surgery- DO not drink clear                 liquids within 2 hours of the start of your surgery.                 Clear Liquids include:  water, apple juice without pulp, clear carbohydrate                 drink such as Clearfast or Gatorade, Black Coffee or Tea (Do not add                 anything to coffee or tea).  __X__2.  On the morning of surgery brush your teeth with toothpaste and water, you                 may rinse your mouth with mouthwash if you wish.  Do not swallow any              toothpaste of mouthwash.     _X__ 3.  No Alcohol for 24 hours before or after surgery.   _X__ 4.  Do Not Smoke or use e-cigarettes For 24 Hours Prior to Your Surgery.                 Do not use any chewable tobacco products for at least 6 hours prior to                 surgery.  ____  5.  Bring all medications with you on the day of surgery if instructed.   __X__  6.  Notify your doctor if there is any change in your medical condition      (cold, fever, infections).     Do not wear jewelry, make-up, hairpins, clips or nail polish. Do not wear lotions, powders, or perfumes.  Do not shave 48 hours prior to surgery. Men may shave face and neck. Do not bring valuables to the hospital.    [facility] is not responsible for any belongings or  valuables.  Contacts, dentures/partials or body piercings may not be worn into surgery. Bring a case for your contacts, glasses or hearing aids, a denture cup will be supplied. Leave your suitcase in the car. After surgery it may be brought to your room. For patients admitted to the hospital, discharge time is determined by your treatment team.   Patients discharged the day of surgery will not be allowed to drive home.   Please read over the following fact sheets that you were given:   MRSA Information  __X__ Take these medicines the morning of surgery with A SIP OF WATER:  1. citalopram (CELEXA)   2.   3.   4.  5.  6.  ____ Fleet Enema (as directed)   __X__ Use CHG Soap/SAGE wipes as directed  ____ Use inhalers on the day of surgery  ____ Stop metformin/Janumet/Farxiga 2 days prior to surgery    ____ Take 1/2 of usual insulin dose the night before surgery. No insulin the morning          of surgery.   ____ Stop Blood Thinners Coumadin/Plavix/Xarelto/Pleta/Pradaxa/Eliquis/Effient/Aspirin  on   Or contact your Surgeon, Cardiologist or Medical Doctor regarding  ability to stop your blood thinners  __X__ Stop Anti-inflammatories 7 days before surgery such as Advil, Ibuprofen, Motrin,  BC or Goodies Powder, Naprosyn, Naproxen, Aleve, Aspirin    __X__ Stop all herbal supplements, fish oil or vitamin E until after surgery.    ____ Bring C-Pap to the hospital.

## 2018-12-19 NOTE — Telephone Encounter (Signed)
Spoke with the patient and let him know he needs to be out of work for 24 hours following surgery. He may return to work on 12/27/18. I have mailed a return to work note to him.

## 2018-12-19 NOTE — Telephone Encounter (Signed)
Patient called and is having surgery on 12/25/18 and wants to go back to work the next day and if so he needs a back to work note and what is restrictions will be.

## 2018-12-21 ENCOUNTER — Other Ambulatory Visit
Admission: RE | Admit: 2018-12-21 | Discharge: 2018-12-21 | Disposition: A | Discharge status: Home / Self Care | Payer: HRSA Program | Source: Ambulatory Visit | Attending: Surgery | Admitting: Surgery

## 2018-12-21 ENCOUNTER — Other Ambulatory Visit: Payer: Self-pay

## 2018-12-21 DIAGNOSIS — Z1159 Encounter for screening for other viral diseases: Secondary | ICD-10-CM | POA: Diagnosis present

## 2018-12-22 ENCOUNTER — Encounter: Payer: Self-pay | Admitting: Anesthesiology

## 2018-12-22 LAB — NOVEL CORONAVIRUS, NAA (HOSP ORDER, SEND-OUT TO REF LAB; TAT 18-24 HRS): SARS-CoV-2, NAA: NOT DETECTED

## 2018-12-25 ENCOUNTER — Encounter
Admission: RE | Disposition: A | Discharge status: Home / Self Care | Payer: Self-pay | Source: Home / Self Care | Attending: Surgery

## 2018-12-25 ENCOUNTER — Ambulatory Visit: Payer: Self-pay | Admitting: Anesthesiology

## 2018-12-25 ENCOUNTER — Ambulatory Visit
Admission: RE | Admit: 2018-12-25 | Discharge: 2018-12-25 | Disposition: A | Discharge status: Home / Self Care | Payer: Self-pay | Attending: Surgery | Admitting: Surgery

## 2018-12-25 ENCOUNTER — Other Ambulatory Visit: Payer: Self-pay

## 2018-12-25 DIAGNOSIS — F17200 Nicotine dependence, unspecified, uncomplicated: Secondary | ICD-10-CM | POA: Insufficient documentation

## 2018-12-25 DIAGNOSIS — L729 Follicular cyst of the skin and subcutaneous tissue, unspecified: Secondary | ICD-10-CM

## 2018-12-25 DIAGNOSIS — F329 Major depressive disorder, single episode, unspecified: Secondary | ICD-10-CM | POA: Insufficient documentation

## 2018-12-25 DIAGNOSIS — L720 Epidermal cyst: Secondary | ICD-10-CM | POA: Insufficient documentation

## 2018-12-25 HISTORY — PX: LESION EXCISION: SHX5167

## 2018-12-25 LAB — URINE DRUG SCREEN, QUALITATIVE (ARMC ONLY)
Amphetamines, Ur Screen: NOT DETECTED
Barbiturates, Ur Screen: NOT DETECTED
Benzodiazepine, Ur Scrn: NOT DETECTED
Cannabinoid 50 Ng, Ur ~~LOC~~: NOT DETECTED
Cocaine Metabolite,Ur ~~LOC~~: NOT DETECTED
MDMA (Ecstasy)Ur Screen: NOT DETECTED
Methadone Scn, Ur: NOT DETECTED
Opiate, Ur Screen: NOT DETECTED
Phencyclidine (PCP) Ur S: NOT DETECTED
Tricyclic, Ur Screen: NOT DETECTED

## 2018-12-25 SURGERY — EXCISION, LESION, SCALP
Anesthesia: General

## 2018-12-25 MED ORDER — CEFAZOLIN SODIUM-DEXTROSE 2-4 GM/100ML-% IV SOLN
2.0000 g | INTRAVENOUS | Status: AC
  Administered 2018-12-25: 2 g via INTRAVENOUS

## 2018-12-25 MED ORDER — FENTANYL CITRATE (PF) 100 MCG/2ML IJ SOLN
INTRAMUSCULAR | Status: AC
  Filled 2018-12-25: qty 2

## 2018-12-25 MED ORDER — PROPOFOL 10 MG/ML IV BOLUS
INTRAVENOUS | Status: AC
  Filled 2018-12-25: qty 20

## 2018-12-25 MED ORDER — GABAPENTIN 300 MG PO CAPS
ORAL_CAPSULE | ORAL | Status: AC
  Administered 2018-12-25: 300 mg via ORAL
  Filled 2018-12-25: qty 1

## 2018-12-25 MED ORDER — CHLORHEXIDINE GLUCONATE CLOTH 2 % EX PADS: 6.0000 | MEDICATED_PAD | Freq: Once | CUTANEOUS | Status: DC

## 2018-12-25 MED ORDER — CEFAZOLIN SODIUM-DEXTROSE 2-4 GM/100ML-% IV SOLN
INTRAVENOUS | Status: AC
  Filled 2018-12-25: qty 100

## 2018-12-25 MED ORDER — OXYCODONE HCL 5 MG PO TABS
ORAL_TABLET | ORAL | Status: AC
  Filled 2018-12-25: qty 1

## 2018-12-25 MED ORDER — OXYCODONE HCL 5 MG PO TABS
5.0000 mg | ORAL_TABLET | ORAL | Status: DC | PRN
  Administered 2018-12-25: 5 mg via ORAL

## 2018-12-25 MED ORDER — FENTANYL CITRATE (PF) 100 MCG/2ML IJ SOLN: 25.0000 ug | INTRAMUSCULAR | Status: DC | PRN

## 2018-12-25 MED ORDER — MIDAZOLAM HCL 2 MG/2ML IJ SOLN
INTRAMUSCULAR | Status: AC
  Filled 2018-12-25: qty 2

## 2018-12-25 MED ORDER — FAMOTIDINE 20 MG PO TABS
ORAL_TABLET | ORAL | Status: AC
  Administered 2018-12-25: 20 mg via ORAL
  Filled 2018-12-25: qty 1

## 2018-12-25 MED ORDER — ACETAMINOPHEN 500 MG PO TABS
1000.0000 mg | ORAL_TABLET | ORAL | Status: AC
  Administered 2018-12-25: 08:00:00 1000 mg via ORAL

## 2018-12-25 MED ORDER — LIDOCAINE HCL (CARDIAC) PF 100 MG/5ML IV SOSY
PREFILLED_SYRINGE | INTRAVENOUS | Status: DC | PRN
  Administered 2018-12-25: 100 mg via INTRAVENOUS

## 2018-12-25 MED ORDER — GABAPENTIN 300 MG PO CAPS
300.0000 mg | ORAL_CAPSULE | ORAL | Status: AC
  Administered 2018-12-25: 08:00:00 300 mg via ORAL

## 2018-12-25 MED ORDER — FENTANYL CITRATE (PF) 100 MCG/2ML IJ SOLN
INTRAMUSCULAR | Status: DC | PRN
  Administered 2018-12-25 (×2): 50 ug via INTRAVENOUS

## 2018-12-25 MED ORDER — ONDANSETRON HCL 4 MG/2ML IJ SOLN: 4.0000 mg | Freq: Once | INTRAMUSCULAR | Status: DC | PRN

## 2018-12-25 MED ORDER — MIDAZOLAM HCL 2 MG/2ML IJ SOLN
INTRAMUSCULAR | Status: DC | PRN
  Administered 2018-12-25: 2 mg via INTRAVENOUS

## 2018-12-25 MED ORDER — ONDANSETRON HCL 4 MG/2ML IJ SOLN
INTRAMUSCULAR | Status: DC | PRN
  Administered 2018-12-25: 4 mg via INTRAVENOUS

## 2018-12-25 MED ORDER — IBUPROFEN 600 MG PO TABS: 600.0000 mg | ORAL_TABLET | Freq: Three times a day (TID) | ORAL | 0 refills | Status: DC | PRN

## 2018-12-25 MED ORDER — BUPIVACAINE HCL (PF) 0.5 % IJ SOLN
INTRAMUSCULAR | Status: DC | PRN
  Administered 2018-12-25: 5 mL

## 2018-12-25 MED ORDER — PHENYLEPHRINE HCL (PRESSORS) 10 MG/ML IV SOLN
INTRAVENOUS | Status: DC | PRN
  Administered 2018-12-25 (×2): 100 ug via INTRAVENOUS

## 2018-12-25 MED ORDER — FAMOTIDINE 20 MG PO TABS
20.0000 mg | ORAL_TABLET | Freq: Once | ORAL | Status: AC
  Administered 2018-12-25: 08:00:00 20 mg via ORAL

## 2018-12-25 MED ORDER — OXYCODONE HCL 5 MG PO TABS: 5.0000 mg | ORAL_TABLET | Freq: Four times a day (QID) | ORAL | 0 refills | Status: DC | PRN

## 2018-12-25 MED ORDER — GLYCOPYRROLATE 0.2 MG/ML IJ SOLN
INTRAMUSCULAR | Status: DC | PRN
  Administered 2018-12-25: 0.2 mg via INTRAVENOUS

## 2018-12-25 MED ORDER — ACETAMINOPHEN 500 MG PO TABS
ORAL_TABLET | ORAL | Status: AC
  Administered 2018-12-25: 1000 mg via ORAL
  Filled 2018-12-25: qty 2

## 2018-12-25 MED ORDER — PROPOFOL 10 MG/ML IV BOLUS
INTRAVENOUS | Status: DC | PRN
  Administered 2018-12-25: 200 mg via INTRAVENOUS

## 2018-12-25 MED ORDER — LACTATED RINGERS IV SOLN
INTRAVENOUS | Status: DC
  Administered 2018-12-25: 08:00:00 via INTRAVENOUS

## 2018-12-25 SURGICAL SUPPLY — 26 items
BLADE CLIPPER SURG (BLADE) ×3 IMPLANT
BLADE SURG 15 STRL LF DISP TIS (BLADE) ×1 IMPLANT
BLADE SURG 15 STRL SS (BLADE) ×2
COVER WAND RF STERILE (DRAPES) ×3 IMPLANT
DERMABOND ADVANCED (GAUZE/BANDAGES/DRESSINGS) ×2
DERMABOND ADVANCED .7 DNX12 (GAUZE/BANDAGES/DRESSINGS) ×1 IMPLANT
DRAPE LAPAROTOMY 77X122 PED (DRAPES) ×3 IMPLANT
DRAPE UTILITY 15X26 TOWEL STRL (DRAPES) ×6 IMPLANT
DRSG TELFA 4X3 1S NADH ST (GAUZE/BANDAGES/DRESSINGS) ×3 IMPLANT
ELECT REM PT RETURN 9FT ADLT (ELECTROSURGICAL) ×3
ELECTRODE REM PT RTRN 9FT ADLT (ELECTROSURGICAL) ×1 IMPLANT
GAUZE SPONGE 4X4 12PLY STRL (GAUZE/BANDAGES/DRESSINGS) IMPLANT
GLOVE BIO SURGEON STRL SZ7 (GLOVE) ×9 IMPLANT
GOWN STRL REUS W/ TWL LRG LVL3 (GOWN DISPOSABLE) ×2 IMPLANT
GOWN STRL REUS W/TWL LRG LVL3 (GOWN DISPOSABLE) ×4
NEEDLE HYPO 25X1 1.5 SAFETY (NEEDLE) ×3 IMPLANT
PACK BASIN MINOR ARMC (MISCELLANEOUS) ×3 IMPLANT
SPONGE KITTNER 5P (MISCELLANEOUS) ×3 IMPLANT
SUT MNCRL 4-0 (SUTURE) ×2
SUT MNCRL 4-0 27XMFL (SUTURE) ×1
SUT VIC AB 3-0 SH 27 (SUTURE) ×2
SUT VIC AB 3-0 SH 27X BRD (SUTURE) ×1 IMPLANT
SUTURE MNCRL 4-0 27XMF (SUTURE) ×1 IMPLANT
SYR 20CC LL (SYRINGE) ×6 IMPLANT
TOWEL OR 17X26 4PK STRL BLUE (TOWEL DISPOSABLE) ×3 IMPLANT
WATER STERILE IRR 1000ML POUR (IV SOLUTION) ×3 IMPLANT

## 2018-12-25 NOTE — Anesthesia Procedure Notes (Signed)
Procedure Name: LMA Insertion Date/Time: 12/25/2018 10:43 AM Performed by: Rona Ravens, CRNA Pre-anesthesia Checklist: Patient identified, Emergency Drugs available, Suction available and Patient being monitored Patient Re-evaluated:Patient Re-evaluated prior to induction Oxygen Delivery Method: Circle system utilized Preoxygenation: Pre-oxygenation with 100% oxygen Induction Type: IV induction Ventilation: Mask ventilation without difficulty LMA: LMA inserted LMA Size: 4.5 Number of attempts: 1 Dental Injury: Teeth and Oropharynx as per pre-operative assessment

## 2018-12-25 NOTE — Transfer of Care (Signed)
Immediate Anesthesia Transfer of Care Note  Patient: Andrew Long  Procedure(s) Performed: EXCISION SCALP LESION RIGHT (N/A )  Patient Location: PACU  Anesthesia Type:General  Level of Consciousness: awake, alert  and oriented  Airway & Oxygen Therapy: Patient Spontanous Breathing and Patient connected to face mask oxygen  Post-op Assessment: Report given to RN and Post -op Vital signs reviewed and stable  Post vital signs: Reviewed and stable  Last Vitals:  Vitals Value Taken Time  BP 124/85 12/25/18 1126  Temp    Pulse 72 12/25/18 1128  Resp 11 12/25/18 1128  SpO2 100 % 12/25/18 1128  Vitals shown include unvalidated device data.  Last Pain:  Vitals:   12/25/18 0748  TempSrc: Oral  PainSc: 0-No pain         Complications: No apparent anesthesia complications

## 2018-12-25 NOTE — Anesthesia Postprocedure Evaluation (Signed)
Anesthesia Post Note  Patient: Andrew Long  Procedure(s) Performed: EXCISION SCALP LESION RIGHT (N/A )  Patient location during evaluation: PACU Anesthesia Type: General Level of consciousness: awake and alert Pain management: pain level controlled Vital Signs Assessment: post-procedure vital signs reviewed and stable Respiratory status: spontaneous breathing, nonlabored ventilation, respiratory function stable and patient connected to nasal cannula oxygen Cardiovascular status: blood pressure returned to baseline and stable Postop Assessment: no apparent nausea or vomiting Anesthetic complications: no     Last Vitals:  Vitals:   12/25/18 1226 12/25/18 1246  BP: 116/80 126/84  Pulse: 80 81  Resp: 14 16  Temp: 36.4 C (!) 36.1 C  SpO2: 100% 100%    Last Pain:  Vitals:   12/25/18 1246  TempSrc: Tympanic  PainSc: [place]

## 2018-12-25 NOTE — Op Note (Signed)
  Procedure Date:  12/25/2018  Pre-operative Diagnosis:  Right scalp sebaceous cyst  Post-operative Diagnosis:  Right scalp sebaceous cyst  Procedure:  Excision of right scalp sebaceous cyst  Surgeon:  Melvyn Neth, MD  Anesthesia:  General endotracheal  Estimated Blood Loss:  2 ml  Specimens:  Right scalp cyst  Complications:  None  Indications for Procedure:  This is a 38 y.o. male with diagnosis of a symptomatic right scalp cyst.  The patient wishes to have this excised. The risks of bleeding, abscess or infection, injury to surrounding structures, and need for further procedures were all discussed with the patient and he was willing to proceed.  Description of Procedure: The patient was correctly identified in the preoperative area and brought into the operating room.  The patient was placed supine with VTE prophylaxis in place.  Appropriate time-outs were performed.  Anesthesia was induced and the patient was intubated.  Appropriate antibiotics were infused.  The patient's right scalp and temporal area were prepped and draped in usual sterile fashion.  The cyst was located on the lateral aspect of the eyebrow towards the corner of the eye.  A 2.5 cm incision was made over the cyst, and cautery was used to dissect down the subcutaneous tissue to the cyst itself.  Skin flaps were created using cautery as well, and then the cyst was excised using cautery.  It was sent off to pathology.  The cavity was then irrigated and hemostasis was assured with cautery.  Local anesthetic was infused intradermally.  The wound was then closed in two layers using 3-0 Vicryl and 4-0 Monocryl.  The incision was cleaned and sealed with DermaBond.  The patient was then emerged from anesthesia, extubated, and brought to the recovery room for further management.    The patient tolerated the procedure well and all counts were correct at the end of the case.   Melvyn Neth, MD

## 2018-12-25 NOTE — Anesthesia Preprocedure Evaluation (Signed)
Anesthesia Evaluation  Patient identified by MRN, date of birth, ID band Patient awake    Reviewed: Allergy & Precautions, NPO status , Patient's Chart, lab work & pertinent test results, reviewed documented beta blocker date and time   Airway Mallampati: II  TM Distance: >3 FB     Dental  (+) Chipped   Pulmonary Current Smoker,           Cardiovascular      Neuro/Psych PSYCHIATRIC DISORDERS Depression    GI/Hepatic   Endo/Other    Renal/GU      Musculoskeletal   Abdominal   Peds  Hematology   Anesthesia Other Findings   Reproductive/Obstetrics                             Anesthesia Physical Anesthesia Plan  ASA: II  Anesthesia Plan: General   Post-op Pain Management:    Induction: Intravenous  PONV Risk Score and Plan:   Airway Management Planned: Oral ETT and LMA  Additional Equipment:   Intra-op Plan:   Post-operative Plan:   Informed Consent: I have reviewed the patients History and Physical, chart, labs and discussed the procedure including the risks, benefits and alternatives for the proposed anesthesia with the patient or authorized representative who has indicated his/her understanding and acceptance.       Plan Discussed with: CRNA  Anesthesia Plan Comments:         Anesthesia Quick Evaluation

## 2018-12-25 NOTE — H&P (Signed)
History of Present Illness: Andrew Long is a 38 y.o. male presents for excision of right scalp cyst.  He was last seen on 5/15.  The cyst is in the temporal area, near the right eyelid and eyebrow, and due to location, I would rather do this in the operating room instead of office.  Patient denies any worsening pain, enlarging of cyst, redness, or drainage, but he reports that it smells.  He is anxious and eager to get it excised.  Past Medical History:     Past Medical History:  Diagnosis Date  . Allergy   . Patient denies medical problems 12/05/14   denies     Past Surgical History:      Past Surgical History:  Procedure Laterality Date  . denies     denies surgeries    Home Medications:        Prior to Admission medications   Medication Sig Start Date End Date Taking? Authorizing Provider  citalopram (CELEXA) 20 MG tablet Take 20 mg by mouth daily.   Yes [provider]    Allergies:     Allergies  Allergen Reactions  . Tramadol Nausea And Vomiting    Review of Systems: Review of Systems  Constitutional: Negative for chills and fever.  Eyes: Negative for blurred vision, pain and discharge.  Respiratory: Negative for shortness of breath.   Cardiovascular: Negative for chest pain.  Gastrointestinal: Negative for abdominal pain, nausea and vomiting.  Skin: Negative for rash.    Physical Exam BP 125/82   Pulse 90   Temp 97.9 F (36.6 C) (Temporal)   Resp 16   Ht 5\' 11"  (1.803 m)   Wt 149 lb 9.6 oz (67.9 kg)   SpO2 98%   BMI 20.86 kg/m  CONSTITUTIONAL: No acute distress HEENT:  Normocephalic, atraumatic, extraocular motion intact.   RESPIRATORY:  Lungs are clear, and breath sounds are equal bilaterally. Normal respiratory effort without pathologic use of accessory muscles. CARDIOVASCULAR: Heart is regular without murmurs, gallops, or rubs. GI: The abdomen is soft, non-distended, non-tender. SKIN:  He has a 2.5 cm superficial, mobile  skin cyst overlying the area between the temporalis and orbicularis muscles lateral to the right eye. There is no erythema, induration, tenderness, or drainage.  NEUROLOGIC:  Motor and sensation is grossly normal.  Cranial nerves are grossly intact. PSYCH:  Alert and oriented to person, place and time. Affect is normal.  Labs/Imaging: None recently  Assessment and Plan: This is a 38 y.o. male with a right scalp cyst.  Plan for excision of right scalp cyst under general anesthesia.  Discussed the risks of bleeding, infection, and injury to surrounding structures.  Discussed post-op medications and prescriptions, outcomes, and expectations.   Melvyn Neth, [facility]

## 2018-12-25 NOTE — Anesthesia Post-op Follow-up Note (Signed)
Anesthesia QCDR form completed.        

## 2018-12-25 NOTE — Discharge Instructions (Signed)

## 2018-12-26 ENCOUNTER — Encounter: Payer: Self-pay | Admitting: Surgery

## 2018-12-26 LAB — SURGICAL PATHOLOGY

## 2018-12-27 ENCOUNTER — Telehealth: Payer: Self-pay | Admitting: *Deleted

## 2018-12-27 NOTE — Telephone Encounter (Signed)
He called to let us know that he has some swelling of his face, s/p scalp excision 12-25-18. He states he did lay on that side last night. Advised to try using an ice pack on and off for today and this evening. He will monitor the area and call back if thing progressively get worse or change, pt agrees.

## 2018-12-28 ENCOUNTER — Telehealth: Payer: Self-pay | Admitting: *Deleted

## 2018-12-28 NOTE — Telephone Encounter (Signed)
Patient states that his eye and face is still swollen and seems to be getting worse slowly. He has been using ice to the area. He will come in tomorrow morning to be seen.

## 2018-12-28 NOTE — Telephone Encounter (Signed)
Patient called and stated that he had surgery on 12/25/18 and he is still swollen and his eye is also swollen, he also stated that he is some pain. Please call and advise

## 2018-12-29 ENCOUNTER — Encounter: Payer: Self-pay | Admitting: Surgery

## 2018-12-29 ENCOUNTER — Ambulatory Visit (INDEPENDENT_AMBULATORY_CARE_PROVIDER_SITE_OTHER): Payer: Self-pay | Admitting: Surgery

## 2018-12-29 ENCOUNTER — Other Ambulatory Visit: Payer: Self-pay

## 2018-12-29 VITALS — BP 142/86 | HR 76 | Temp 97.5°F | Resp 16 | Ht 71.0 in | Wt 152.0 lb

## 2018-12-29 DIAGNOSIS — L729 Follicular cyst of the skin and subcutaneous tissue, unspecified: Secondary | ICD-10-CM

## 2018-12-29 DIAGNOSIS — Z09 Encounter for follow-up examination after completed treatment for conditions other than malignant neoplasm: Secondary | ICD-10-CM

## 2018-12-29 NOTE — Patient Instructions (Addendum)
Please call with any questions or concerns. Please take ibuprofen 800 mg.

## 2018-12-29 NOTE — Progress Notes (Signed)
12/29/2018  HPI: Andrew Long is a 38 y.o. male s/p right scalp cyst excision on 6/15.  He called the office saying that he was still having pain and swelling around the incision and wanted to be seen sooner.  He denies any fevers, chills, chest pain or shortness of breath.  He denies any drainage from the wound, but only swelling.  He is taking Ibuprofen and Oxycodone for pain.  Vital signs: BP (!) 142/86   Pulse 76   Temp (!) 97.5 F (36.4 C) (Temporal)   Resp 16   Ht 5\' 11"  (1.803 m)   Wt 152 lb (68.9 kg)   SpO2 99%   BMI 21.20 kg/m    Physical Exam: Constitutional: No acute distress Skin:  Right lateral eyebrow incision is clean, dry, and intact, with post-operative normal swelling under the incision.  There is no fluctuance, cellulitis, or drainage.  There is appropriate soreness to palpation over the incision and surrounding.  Assessment/Plan: This is a 38 y.o. male s/p right scalp cyst excision.  --Discussed with the patient that currently there is no evidence of any infection of the wound.  Reassured the patient that the soreness and swelling are normal after surgery as part of the post-operative healing.  These should continue to improve. --Recommended that he increase the dose of ibuprofen to 800 mg q8hr prn.  This should help better than narcotics. --Patient will follow on up on 6/26.  May cancel appointment if he's doing well.   Melvyn Neth, [facility]

## 2019-01-05 ENCOUNTER — Other Ambulatory Visit: Payer: Self-pay

## 2019-01-05 ENCOUNTER — Telehealth (INDEPENDENT_AMBULATORY_CARE_PROVIDER_SITE_OTHER): Payer: Self-pay | Admitting: Surgery

## 2019-01-05 DIAGNOSIS — L729 Follicular cyst of the skin and subcutaneous tissue, unspecified: Secondary | ICD-10-CM

## 2019-01-05 NOTE — Progress Notes (Signed)
Virtual Visit via Telephone Note  I connected with Andrew Long on 01/05/19 at 10:15 AM EDT by telephone and verified that I am speaking with the correct person using two identifiers.  Location: Patient: Work Provider: Office   I discussed the limitations, risks, security and privacy concerns of performing an evaluation and management service by telephone and the availability of in person appointments. I also discussed with the patient that there may be a patient responsible charge related to this service. The patient expressed understanding and agreed to proceed.   History of Present Illness: Patient is status post right scalp cyst excision on 12/25/2018.  We have a phone interview today to check on his postop recovery.  He was seen in the office on 6/19 because of concerns for swelling and pain in the incision but this was attributed to postop changes rather than any complications.  Today he reports feeling well with no further pain.  Reports that the swelling continues to go down although is only slightly present.  Denies any troubles with the incision.  Reports that the Dermabond glue is still in place and started peeling in 1 corner.   Observations/Objective: Patient appears comfortable in no distress.  Able to speak in clear sentences with no shortness of breath or respiratory distress  Assessment and Plan: 38 year old male status post right scalp cyst excision.  -Patient is healing well and reassured by his progress.  At this point patient may follow-up PRN.  Informed him that the Dermabond glue will continue peeling off on its own but he can peel it off after 2 weeks postop if he wishes.  Otherwise he can reach [place] if any questions or concerns.  Follow Up Instructions:    I discussed the assessment and treatment plan with the patient. The patient was provided an opportunity to ask questions and all were answered. The patient agreed with the plan and demonstrated an understanding of  the instructions.   The patient was advised to call back or seek an in-person evaluation if the symptoms worsen or if the condition fails to improve as anticipated.  I provided 8 minutes of non-face-to-face time during this encounter.   [name], MD

## 2019-01-26 ENCOUNTER — Telehealth: Payer: Self-pay | Admitting: *Deleted

## 2019-01-26 NOTE — Telephone Encounter (Signed)
aptt next week Dr. Hampton Abbot

## 2019-01-26 NOTE — Telephone Encounter (Signed)
Offer him an appt today but could not come on

## 2019-01-26 NOTE — Telephone Encounter (Signed)
Patient called and stated that he had a procedure done on 12/25/18 by Dr.Piscoya (excision scalp cyst) and now its swollen, started a day ago, its not draining but its a little red. Please call and advise

## 2019-01-27 ENCOUNTER — Emergency Department
Admission: EM | Admit: 2019-01-27 | Discharge: 2019-01-27 | Disposition: A | Discharge status: Home / Self Care | Payer: Self-pay | Attending: Emergency Medicine | Admitting: Emergency Medicine

## 2019-01-27 ENCOUNTER — Other Ambulatory Visit: Payer: Self-pay

## 2019-01-27 ENCOUNTER — Encounter: Payer: Self-pay | Admitting: Emergency Medicine

## 2019-01-27 DIAGNOSIS — F17210 Nicotine dependence, cigarettes, uncomplicated: Secondary | ICD-10-CM | POA: Insufficient documentation

## 2019-01-27 DIAGNOSIS — L03211 Cellulitis of face: Secondary | ICD-10-CM | POA: Insufficient documentation

## 2019-01-27 MED ORDER — SULFAMETHOXAZOLE-TRIMETHOPRIM 800-160 MG PO TABS: 1.0000 | ORAL_TABLET | Freq: Once | ORAL | Status: DC

## 2019-01-27 MED ORDER — HYDROCODONE-ACETAMINOPHEN 5-325 MG PO TABS: 1.0000 | ORAL_TABLET | ORAL | 0 refills | Status: DC | PRN

## 2019-01-27 MED ORDER — SULFAMETHOXAZOLE-TRIMETHOPRIM 800-160 MG PO TABS: 1.0000 | ORAL_TABLET | Freq: Two times a day (BID) | ORAL | 0 refills | Status: DC

## 2019-01-27 NOTE — ED Notes (Addendum)
This RN received a phone call from pt's POC. Phone provided to pt to talk to [place] on file.

## 2019-01-27 NOTE — ED Provider Notes (Signed)
[facility] Emergency Department Provider Note  ____________________________________________  Time seen: Approximately 7:03 PM  I have reviewed the triage vital signs and the nursing notes.   HISTORY  Chief Complaint [place]    HPI Andrew Long is a 38 y.o. male who presents to the emergency department complaining of possible infection to the side of the face.  Patient reports that he had a cyst removed by general surgery approximately a month ago.  Over the past 2 to 3 days he is developed pain, redness to the area.  Patient has had a minimal amount of purulent drainage from the site.  No fevers or chills.  No other complaints at this time.  No medications prior to arrival.         Past Medical History:  Diagnosis Date  . Allergy   . Depression   . Patient denies medical problems 12/05/14   denies    Patient Active Problem List   Diagnosis Date Noted  . Scalp cyst 10/27/2018  . Healthcare maintenance 10/19/2018    Past Surgical History:  Procedure Laterality Date  . denies     denies surgeries  . LESION EXCISION N/A 12/25/2018   Procedure: EXCISION SCALP LESION RIGHT;  Surgeon: Henrene DodgePiscoya, Jose, MD;  Location: [facility] ORS;  Service: General;  Laterality: N/A;    Prior to Admission medications   Medication Sig Start Date End Date Taking? Authorizing Provider  citalopram (CELEXA) 20 MG tablet Take 20 mg by mouth daily.    [provider]  HYDROcodone-acetaminophen (NORCO/VICODIN) 5-325 MG tablet Take 1 tablet by mouth every 4 (four) hours as needed for moderate pain. 01/27/19   Cuthriell, Delorise RoyalsJonathan D, PA-C  ibuprofen (ADVIL) 600 MG tablet Take 1 tablet (600 mg total) by mouth every 8 (eight) hours as needed for fever, mild pain or moderate pain. 12/25/18   Henrene DodgePiscoya, Jose, MD  Multiple Vitamin (MULTIVITAMIN WITH MINERALS) TABS tablet Take 1 tablet by mouth daily.    [provider]  sulfamethoxazole-trimethoprim (BACTRIM DS) 800-160 MG  tablet Take 1 tablet by mouth 2 (two) times daily. 01/27/19   Cuthriell, Delorise RoyalsJonathan D, PA-C    Allergies Tramadol  Family History  Problem Relation Age of Onset  . Heart failure Mother   . Diabetes Mother     Social History Social History   Tobacco Use  . Smoking status: Current Every Day Smoker    Packs/day: 0.30    Types: Cigarettes  . Smokeless tobacco: Never Used  Substance Use Topics  . Alcohol use: Not Currently    Alcohol/week: 28.0 standard drinks    Types: 14 Cans of beer, 14 Standard drinks or equivalent per week    Comment: pt states "a lot. whatever I can get."  . Drug use: Not Currently    Types: Cocaine    Comment: not currently     Review of Systems  Constitutional: No fever/chills Eyes: No visual changes. No discharge ENT: No upper respiratory complaints. Cardiovascular: no chest pain. Respiratory: no cough. No SOB. Gastrointestinal: No abdominal pain.  No nausea, no vomiting.  No diarrhea.  No constipation. Musculoskeletal: Negative for musculoskeletal pain. Skin: Possible infection to the right side of the face Neurological: Negative for headaches, focal weakness or numbness. 10-point ROS otherwise negative.  ____________________________________________   PHYSICAL EXAM:  VITAL SIGNS: ED Triage Vitals  Enc Vitals Group     BP 01/27/19 1549 123/71     Pulse Rate 01/27/19 1549 83     Resp 01/27/19  1549 18     Temp 01/27/19 1549 99.3 F (37.4 C)     Temp Source 01/27/19 1549 Oral     SpO2 01/27/19 1549 97 %     Weight 01/27/19 1550 152 lb (68.9 kg)     Height 01/27/19 1550 5\' 11"  (1.803 m)     Head Circumference --      Peak Flow --      Pain Score 01/27/19 1549 9     Pain Loc --      Pain Edu? --      Excl. in [place]? --      Constitutional: Alert and oriented. Well appearing and in no acute distress. Eyes: Conjunctivae are normal. PERRL. EOMI. Head: Visualization of the right side of the face reveals an erythematous and edematous lesion  just lateral to the eyebrow.  Patient had a dermal cyst removed by general surgery a month ago.  Area had healed well until patient developed erythema and edema to the site.  Patient has findings consistent with cellulitis.  Firmness with tenderness but no fluctuance or induration.  Dried scab consistent with purulent drainage is appreciated.  Palpation does not elicit any further ENT:      Ears:       Nose: No congestion/rhinnorhea.      Mouth/Throat: Mucous membranes are moist.  Neck: No stridor.  No cervical spine tenderness to palpation. Hematological/Lymphatic/Immunilogical: No cervical lymphadenopathy. Cardiovascular: Normal rate, regular rhythm. Normal S1 and S2.  Good peripheral circulation. Respiratory: Normal respiratory effort without tachypnea or retractions. Lungs CTAB. Good air entry to the bases with no decreased or absent breath sounds. Musculoskeletal: Full range of motion to all extremities. No gross deformities appreciated. Neurologic:  Normal speech and language. No gross focal neurologic deficits are appreciated.  Skin:  Skin is warm, dry and intact. No rash noted. Psychiatric: Mood and affect are normal. Speech and behavior are normal. Patient exhibits appropriate insight and judgement.   ____________________________________________   LABS (all labs ordered are listed, but only abnormal results are displayed)  Labs Reviewed - No data to display ____________________________________________  EKG   ____________________________________________  RADIOLOGY   No results found.  ____________________________________________    PROCEDURES  Procedure(s) performed:    Procedures    Medications  sulfamethoxazole-trimethoprim (BACTRIM DS) 800-160 MG per tablet 1 tablet (has no administration in time range)     ____________________________________________   INITIAL IMPRESSION / ASSESSMENT AND PLAN / ED COURSE  Pertinent labs & imaging results that were  available during my care of the patient were reviewed by me and considered in my medical decision making (see chart for details).  Review of the [place] CSRS was performed in accordance of the [place] prior to dispensing any controlled drugs.           Patient's diagnosis is consistent with cellulitis to the face.  Patient presented to the emergency department with an erythematous and edematous lesion to a previous surgical site.  Findings are consistent with cellulitis.  No indication of abscess at this time.  No indication for incision and drainage.  No indication for labs or imaging.  Patient will be placed on Bactrim and limited pain medication.  Follow-up with general surgery..Patient is given ED precautions to return to the ED for any worsening or new symptoms.     ____________________________________________  FINAL CLINICAL IMPRESSION(S) / ED DIAGNOSES  Final diagnoses:  Cellulitis, face      NEW MEDICATIONS STARTED DURING THIS VISIT:  ED Discharge  Orders         Ordered    sulfamethoxazole-trimethoprim (BACTRIM DS) 800-160 MG tablet  2 times daily     01/27/19 1910    HYDROcodone-acetaminophen (NORCO/VICODIN) 5-325 MG tablet  Every 4 hours PRN     01/27/19 1910              This chart was dictated using voice recognition software/Dragon. Despite best efforts to proofread, errors can occur which can change the meaning. Any change was purely unintentional.    Racheal PatchesCuthriell, Jonathan D, PA-C 01/27/19 1917    Concha SeFunke, Mary E, MD 01/28/19 1322

## 2019-01-27 NOTE — ED Triage Notes (Signed)
States had cyst removed R temple 6/15th. Now has raised area at surgical site.

## 2019-01-31 ENCOUNTER — Encounter: Payer: Self-pay | Admitting: Surgery

## 2019-01-31 ENCOUNTER — Ambulatory Visit (INDEPENDENT_AMBULATORY_CARE_PROVIDER_SITE_OTHER): Payer: Self-pay | Admitting: Surgery

## 2019-01-31 ENCOUNTER — Other Ambulatory Visit: Payer: Self-pay

## 2019-01-31 VITALS — BP 117/74 | HR 85 | Temp 97.7°F | Resp 14 | Ht 71.0 in | Wt 148.8 lb

## 2019-01-31 DIAGNOSIS — Z09 Encounter for follow-up examination after completed treatment for conditions other than malignant neoplasm: Secondary | ICD-10-CM

## 2019-01-31 DIAGNOSIS — L729 Follicular cyst of the skin and subcutaneous tissue, unspecified: Secondary | ICD-10-CM

## 2019-01-31 NOTE — Progress Notes (Signed)
01/31/2019  HPI: Andrew Long is a 38 y.o. male s/p excision of right scalp cyst on 12/25/18.  He presented to the ED on 7/18 with a wound infection, draining purulent fluid.  He was started on Bactrim and has been improving.  Denies any worsening pain.  Reports there is a tail of suture sticking from the corner of the incision.  Vital signs: BP 117/74   Pulse 85   Temp 97.7 F (36.5 C) (Temporal)   Resp 14   Ht 5\' 11"  (1.803 m)   Wt 148 lb 12.8 oz (67.5 kg)   SpO2 98%   BMI 20.75 kg/m    Physical Exam: Constitutional: No acute distress Skin:  Right scalp incision at the right eyebrow level is healing well, with minimal to no fluctuance, though able to express very small amount of residual purulent fluid.  The lateral corner of the incision is opened for 2 mm length and monocryl suture tail sticks out which was trimmed.  BandAid applied.  Assessment/Plan: This is a 38 y.o. male s/p right scalp cyst excision.  --Discussed with the patient that it is unusual for a post-op infection to happen a month out of surgery, but he should nonetheless continue his antibiotic course until completed.  The corner of the wound should heal on its own and he can do dressing change once daily until healed. --Follow up in 10-14 days for wound check.   Melvyn Neth, [facility]

## 2019-02-13 ENCOUNTER — Encounter: Payer: Self-pay | Admitting: Surgery

## 2019-02-18 ENCOUNTER — Other Ambulatory Visit: Payer: Self-pay

## 2019-02-18 ENCOUNTER — Encounter: Payer: Self-pay | Admitting: Intensive Care

## 2019-02-18 ENCOUNTER — Emergency Department
Admission: EM | Admit: 2019-02-18 | Discharge: 2019-02-18 | Disposition: A | Discharge status: Home / Self Care | Payer: Self-pay | Attending: Emergency Medicine | Admitting: Emergency Medicine

## 2019-02-18 DIAGNOSIS — R202 Paresthesia of skin: Secondary | ICD-10-CM

## 2019-02-18 DIAGNOSIS — Y990 Civilian activity done for income or pay: Secondary | ICD-10-CM | POA: Insufficient documentation

## 2019-02-18 DIAGNOSIS — X503XXA Overexertion from repetitive movements, initial encounter: Secondary | ICD-10-CM | POA: Insufficient documentation

## 2019-02-18 DIAGNOSIS — Y939 Activity, unspecified: Secondary | ICD-10-CM | POA: Insufficient documentation

## 2019-02-18 DIAGNOSIS — S56911A Strain of unspecified muscles, fascia and tendons at forearm level, right arm, initial encounter: Secondary | ICD-10-CM | POA: Insufficient documentation

## 2019-02-18 DIAGNOSIS — F17210 Nicotine dependence, cigarettes, uncomplicated: Secondary | ICD-10-CM | POA: Insufficient documentation

## 2019-02-18 DIAGNOSIS — Y929 Unspecified place or not applicable: Secondary | ICD-10-CM | POA: Insufficient documentation

## 2019-02-18 DIAGNOSIS — S56912A Strain of unspecified muscles, fascia and tendons at forearm level, left arm, initial encounter: Secondary | ICD-10-CM | POA: Insufficient documentation

## 2019-02-18 LAB — COMPREHENSIVE METABOLIC PANEL
ALT: 15 U/L (ref 0–44)
AST: 18 U/L (ref 15–41)
Albumin: 4.2 g/dL (ref 3.5–5.0)
Alkaline Phosphatase: 58 U/L (ref 38–126)
Anion gap: 9 (ref 5–15)
BUN: 11 mg/dL (ref 6–20)
CO2: 26 mmol/L (ref 22–32)
Calcium: 9.4 mg/dL (ref 8.9–10.3)
Chloride: 105 mmol/L (ref 98–111)
Creatinine, Ser: 0.74 mg/dL (ref 0.61–1.24)
GFR calc Af Amer: >60 mL/min (ref >60)
GFR calc non Af Amer: >60 mL/min (ref >60)
Glucose, Bld: 116 mg/dL — ABNORMAL HIGH (ref 70–99)
Potassium: 3.7 mmol/L (ref 3.5–5.1)
Sodium: 140 mmol/L (ref 135–145)
Total Bilirubin: 0.7 mg/dL (ref 0.3–1.2)
Total Protein: 7 g/dL (ref 6.5–8.1)

## 2019-02-18 LAB — CBC WITH DIFFERENTIAL/PLATELET
Abs Immature Granulocytes: 0.02 10*3/uL (ref 0.00–0.07)
Basophils Absolute: 0.1 10*3/uL (ref 0.0–0.1)
Basophils Relative: 1 %
Eosinophils Absolute: 0.2 10*3/uL (ref 0.0–0.5)
Eosinophils Relative: 3 %
HCT: 43.9 % (ref 39.0–52.0)
Hemoglobin: 14.3 g/dL (ref 13.0–17.0)
Immature Granulocytes: 0 %
Lymphocytes Relative: 38 %
Lymphs Abs: 2.5 10*3/uL (ref 0.7–4.0)
MCH: 28.2 pg (ref 26.0–34.0)
MCHC: 32.6 g/dL (ref 30.0–36.0)
MCV: 86.6 fL (ref 80.0–100.0)
Monocytes Absolute: 0.6 10*3/uL (ref 0.1–1.0)
Monocytes Relative: 9 %
Neutro Abs: 3.3 10*3/uL (ref 1.7–7.7)
Neutrophils Relative %: 49 %
Platelets: 212 10*3/uL (ref 150–400)
RBC: 5.07 MIL/uL (ref 4.22–5.81)
RDW: 14.4 % (ref 11.5–15.5)
WBC: 6.7 10*3/uL (ref 4.0–10.5)
nRBC: 0 % (ref 0.0–0.2)

## 2019-02-18 MED ORDER — CYCLOBENZAPRINE HCL 5 MG PO TABS: 5.0000 mg | ORAL_TABLET | Freq: Three times a day (TID) | ORAL | 0 refills | Status: DC | PRN

## 2019-02-18 NOTE — Discharge Instructions (Addendum)
Your exam is consistent with muscle strain and overuse to the forearm muscles. This can cause achy, stiff and painful muscles. This can also cause some numbness and tingling in the hands. Your labs and exam are otherwise normal. Take the muscle relaxant as needed. Follow-up with Ortho for ongoing problems.

## 2019-02-18 NOTE — ED Notes (Signed)
Pt reports that he has numbness in bilat hands and wrist - he states it feels like pins and needles and wakes him from sleep - this has been present x1 1/2 months - denies any injury - pt works on an [employer], television/film set

## 2019-02-18 NOTE — ED Provider Notes (Signed)
[name] ____________________________________________  Time seen: 1138  I have reviewed the triage vital signs and the nursing notes.  HISTORY  Chief Complaint  Numbness (bilateral hands)  HPI Andrew Long is a 38 y.o. male presents to the ED for evaluation of a 1-07/5985-month complaint of intermittent muscle pain to the forearms as well as some intermittent hand paresthesias.  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  He denies any trauma, accident, fall.  He also denies any hand swelling, skin temp/color changes, or grip changes.  He describes symptoms are aggravated by his work activities, and rarely are improved on his 1 day of the week that is off.  He denies any history of chronic or ongoing hand pain or symptoms.  He presents now for evaluation of his symptoms he denies any chest pain, shortness of breath, fever, chills, sweats.  He also denies any neck pain, trauma, fall, or accidents.  He has not taken any medications in the interim for symptom relief.  Has not sought care with any other provider for this complaint.  Past Medical History:  Diagnosis Date  . Allergy   . Depression   . Patient denies medical problems 12/05/14   denies    Patient Active Problem List   Diagnosis Date Noted  . Scalp cyst 10/27/2018  . Healthcare maintenance 10/19/2018    Past Surgical History:  Procedure Laterality Date  . denies     denies surgeries  . LESION EXCISION N/A 12/25/2018   Procedure: EXCISION SCALP LESION RIGHT;  Surgeon: Henrene DodgePiscoya, Jose, MD;  Location: [facility] ORS;  Service: General;  Laterality: N/A;    Prior to Admission medications   Medication Sig Start Date End Date Taking? Authorizing Provider  citalopram (CELEXA) 20 MG tablet Take 20 mg by mouth daily.    [provider]  cyclobenzaprine (FLEXERIL) 5 MG tablet Take 1 tablet  (5 mg total) by mouth 3 (three) times daily as needed. 02/18/19   Ravneet Spilker, Charlesetta IvoryJenise V Bacon, PA-C  Multiple Vitamin (MULTIVITAMIN WITH MINERALS) TABS tablet Take 1 tablet by mouth daily.    [provider]    Allergies Tramadol  Family History  Problem Relation Age of Onset  . Heart failure Mother   . Diabetes Mother     Social History Social History   Tobacco Use  . Smoking status: Current Every Day Smoker    Packs/day: 0.30    Types: Cigarettes  . Smokeless tobacco: Never Used  Substance Use Topics  . Alcohol use: Not Currently    Alcohol/week: 28.0 standard drinks    Types: 14 Cans of beer, 14 Standard drinks or equivalent per week    Comment: pt states "a lot. whatever I can get."  . Drug use: Not Currently    Types: Cocaine    Comment: not currently    Review of Systems  Constitutional: Negative for fever. Eyes: Negative for visual changes. ENT: Negative for sore throat. Cardiovascular: Negative for chest pain. Respiratory: Negative for shortness of breath. Gastrointestinal: Negative for abdominal pain, vomiting and diarrhea. Genitourinary: Negative for dysuria. Musculoskeletal: Negative for back pain. BUE muscle pain as above.  Skin: Negative for rash. Neurological: Negative for headaches, focal weakness or numbness. Reports BUE paresthesias as noted.  ____________________________________________  PHYSICAL EXAM:  VITAL SIGNS: ED Triage Vitals  Enc Vitals Group     BP 02/18/19 1000 117/76  Pulse Rate 02/18/19 1000 80     Resp 02/18/19 1000 14     Temp 02/18/19 1000 98.6 F (37 C)     Temp Source 02/18/19 1000 Oral     SpO2 02/18/19 1000 98 %     Weight 02/18/19 1002 154 lb (69.9 kg)     Height 02/18/19 1002 6' (1.829 m)     Head Circumference --      Peak Flow --      Pain Score 02/18/19 1002 9     Pain Loc --      Pain Edu? --      Excl. in [place]? --     Constitutional: Alert and oriented. Well appearing and in no distress. Head:  Normocephalic and atraumatic. Eyes: Conjunctivae are normal. Normal extraocular movements Neck: Supple. No thyromegaly. Cardiovascular: Normal rate, regular rhythm. Normal distal pulses. Respiratory: Normal respiratory effort.  Musculoskeletal: mildly tenderness to palpation along the forearm musculature bilaterally. Normal composite fists bilaterally. Nontender with normal range of motion in all extremities.  Neurologic: CN II-XII grossly intact. Normal intrinsic and opposition testing. Normal UE DTRs bilaterally. Mildly positive carpal tinel's bilaterally. Negative Cubital tinel's bilaterally. Normal gait without ataxia. Normal speech and language. No gross focal neurologic deficits are appreciated. Skin:  Skin is warm, dry and intact. No rash noted. ____________________________________________   LABS (pertinent positives/negatives) Labs Reviewed  COMPREHENSIVE METABOLIC PANEL - Abnormal; Notable for the following components:      Result Value   Glucose, Bld 116 (*)    All other components within normal limits  CBC WITH DIFFERENTIAL/PLATELET  ____________________________________________  PROCEDURES  Procedures ___________________________________________  INITIAL IMPRESSION / ASSESSMENT AND PLAN / ED COURSE  Andrew Long was evaluated in Emergency Department on 02/18/2019 for the symptoms described in the history of present illness. He was evaluated in the context of the global COVID-19 pandemic, which necessitated consideration that the patient might be at risk for infection with the SARS-CoV-2 virus that causes COVID-19. Institutional protocols and algorithms that pertain to the evaluation of patients at risk for COVID-19 are in a state of rapid change based on information released by regulatory bodies including the CDC and federal and state organizations. These policies and algorithms were followed during the patient's care in the ED.  Patient with ED evaluation bilateral forearm  muscular pain and stiffness as well as some intermittent hand paresthesias.  Patient's clinical picture is reassuring as it shows no acute neuromuscular deficit.  Symptoms are consistent with a probable muscle strain and overuse syndrome to the bilateral forearms.  Patient is advised to take the anti-inflammatory muscle relaxant as provided.  Is also encouraged to perform forearm stretches and activities prior to work activities.  He is referred to orthopedics for ongoing symptom management.  Return precautions have been reviewed. ____________________________________________  FINAL CLINICAL IMPRESSION(S) / ED DIAGNOSES  Final diagnoses:  Paresthesias  Repetitive strain injury of both forearms      Carmie End, Dannielle Karvonen, PA-C 02/18/19 1559    Arta Silence, MD 02/18/19 [id number]

## 2019-02-18 NOTE — ED Notes (Signed)
FIRST NURSE NOTE:  PT reports numbness to hand and arms, reports feels like pins and needles x 1 - 1.5 months. No distress noted on arrival.

## 2019-02-18 NOTE — ED Triage Notes (Signed)
PAtient c/o pain and numbness/tingling from forearm to hands X1 month

## 2019-03-01 ENCOUNTER — Ambulatory Visit: Payer: Self-pay | Admitting: Gerontology

## 2019-03-01 ENCOUNTER — Encounter: Payer: Self-pay | Admitting: Gerontology

## 2019-03-01 ENCOUNTER — Other Ambulatory Visit: Payer: Self-pay

## 2019-03-01 VITALS — BP 135/88 | HR 72 | Temp 97.0°F | Ht 72.0 in | Wt 158.0 lb

## 2019-03-01 DIAGNOSIS — R202 Paresthesia of skin: Secondary | ICD-10-CM | POA: Insufficient documentation

## 2019-03-01 MED ORDER — GABAPENTIN 100 MG PO CAPS: 100.0000 mg | ORAL_CAPSULE | Freq: Two times a day (BID) | ORAL | 0 refills | Status: DC

## 2019-03-01 NOTE — Progress Notes (Signed)
Established Patient Office Visit  Subjective:  Patient ID: Andrew Long, male    DOB: [birth year]  Age: 38 y.o. MRN: [id number]  CC:  Chief Complaint  Patient presents with  . Pain    pain and numbness in hands extending to elbows, ongoing for 2+ months. Right worse than left.     HPI Andrew Long presents for c/o worsening paresthesia to his hands. He has missed several of his appointments. He was evaluated at the ED on 02/18/19 for paresthesia to bilateral hands and was treated with cyclobenzaprine 5 mg tid. He reports that he continues to experience constant numbness, and 9/10 tingling pain to bilateral hands, right is worst than left. 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 He states that he drop things and unable to make a fist. He denies injury, motor weakness, chest pain, fever, chills and no further concerns.  Past Medical History:  Diagnosis Date  . Allergy   . Depression   . Patient denies medical problems 12/05/14   denies    Past Surgical History:  Procedure Laterality Date  . denies     denies surgeries  . LESION EXCISION N/A 12/25/2018   Procedure: EXCISION SCALP LESION RIGHT;  Surgeon: [name], MD;  Location: [facility] ORS;  Service: General;  Laterality: N/A;    Family History  Problem Relation Age of Onset  . Heart failure Mother   . Diabetes Mother     Social History   Socioeconomic History  . Marital status: Single    Spouse name: Not on file  . Number of children: 2  . Years of education: Not on file  . Highest education level: Not on file  Occupational History  . Not on file  Social Needs  . Financial resource strain: Not on file  . Food insecurity    Worry: Not on file    Inability: Not on file  . Transportation needs   Medical: Not on file    Non-medical: Not on file  Tobacco Use  . Smoking status: Current Every Day Smoker    Packs/day: 0.30    Types: Cigarettes  . Smokeless tobacco: Never Used  Substance and Sexual Activity  . Alcohol use: Not Currently    Alcohol/week: 28.0 standard drinks    Types: 14 Cans of beer, 14 Standard drinks or equivalent per week    Comment: pt states "a lot. whatever I can get."  . Drug use: Not Currently    Types: Cocaine    Comment: not currently  . Sexual activity: Yes    Birth control/protection: Condom  Lifestyle  . Physical activity    Days per week: Not on file    Minutes per session: Not on file  . Stress: Not on file  Relationships  . Social Herbalist on phone: Not on file    Gets together: Not on file    Attends religious service: Not on file    Active member of club or organization: Not on file    Attends meetings of clubs or organizations: Not on file    Relationship status: Not on file  . Intimate partner violence    Fear of current or ex partner: Not on file    Emotionally  abused: Not on file    Physically abused: Not on file    Forced sexual activity: Not on file  Other Topics Concern  . Not on file  Social History Narrative  . Not on file    Outpatient Medications Prior to Visit  Medication Sig Dispense Refill  . citalopram (CELEXA) 20 MG tablet Take 20 mg by mouth daily.    . Multiple Vitamin (MULTIVITAMIN WITH MINERALS) TABS tablet Take 1 tablet by mouth daily.    . cyclobenzaprine (FLEXERIL) 5 MG tablet Take 1 tablet (5 mg total) by mouth 3 (three) times daily as needed. (Patient not taking: Reported on 03/01/2019) 15 tablet 0   No facility-administered medications prior to visit.     Allergies  Allergen Reactions  . Tramadol Nausea And Vomiting    ROS Review of Systems  Constitutional: Negative.   Respiratory: Negative.   Cardiovascular: Negative.   Musculoskeletal: Negative.   Neurological: Positive for  numbness.  Psychiatric/Behavioral: Negative.       Objective:    Physical Exam  Constitutional: He appears well-developed and well-nourished.  HENT:  Head: Normocephalic and atraumatic.  Eyes: Pupils are equal, round, and reactive to light. EOM are normal.  Neck: Normal range of motion. Neck supple.  Cardiovascular: Normal rate and regular rhythm.  Pulmonary/Chest: Effort normal and breath sounds normal.  Abdominal: Soft. Bowel sounds are normal.  Musculoskeletal: Normal range of motion.  Neurological:  Positive tinnel's and phelan's test.  Skin: Skin is warm and dry.    BP 135/88 (BP Location: Right Arm, Patient Position: Sitting)   Pulse 72   Temp (!) 97 F (36.1 C)   Ht 6' (1.829 m)   Wt 158 lb (71.7 kg)   BMI 21.43 kg/m  Wt Readings from Last 3 Encounters:  03/01/19 158 lb (71.7 kg)  02/18/19 154 lb (69.9 kg)  01/31/19 148 lb 12.8 oz (67.5 kg)     Health Maintenance Due  Topic Date Due  . HIV Screening  04/23/1998  . TETANUS/TDAP  04/23/2002  . INFLUENZA VACCINE  02/10/2019    There are no preventive care reminders to display for this patient.  Lab Results  Component Value Date   TSH 0.709 09/21/2018   Lab Results  Component Value Date   WBC 6.7 02/18/2019   HGB 14.3 02/18/2019   HCT 43.9 02/18/2019   MCV 86.6 02/18/2019   PLT 212 02/18/2019   Lab Results  Component Value Date   NA 140 02/18/2019   K 3.7 02/18/2019   CO2 26 02/18/2019   GLUCOSE 116 (H) 02/18/2019   BUN 11 02/18/2019   CREATININE 0.74 02/18/2019   BILITOT 0.7 02/18/2019   ALKPHOS 58 02/18/2019   AST 18 02/18/2019   ALT 15 02/18/2019   PROT 7.0 02/18/2019   ALBUMIN 4.2 02/18/2019   CALCIUM 9.4 02/18/2019   ANIONGAP 9 02/18/2019   Lab Results  Component Value Date   CHOL 145 09/21/2018   Lab Results  Component Value Date   HDL 38 (L) 09/21/2018   Lab Results  Component Value Date   LDLCALC 62 09/21/2018   Lab Results  Component Value Date   TRIG 226 (H)  09/21/2018   Lab Results  Component Value Date   CHOLHDL 3.8 09/21/2018   Lab Results  Component Value Date   HGBA1C 5.6 09/21/2018      Assessment & Plan:     1. Paresthesia of both hands - Ddx CTS, he was advised to wear hand  splints, he was advised on the medication side effects and to notify clinic. - gabapentin (NEURONTIN) 100 MG capsule; Take 1 capsule (100 mg total) by mouth 2 (two) times daily.  Dispense: 60 capsule; Refill: 0   Follow-up: Return in about 1 month (around 04/01/2019), or if symptoms worsen or fail to improve.    Cleven Jansma Trellis PaganiniE Teniyah Seivert, NP

## 2019-03-23 ENCOUNTER — Other Ambulatory Visit: Payer: Self-pay | Admitting: Gerontology

## 2019-03-23 DIAGNOSIS — R202 Paresthesia of skin: Secondary | ICD-10-CM

## 2019-03-29 ENCOUNTER — Encounter: Payer: Self-pay | Admitting: Gerontology

## 2019-03-29 ENCOUNTER — Other Ambulatory Visit: Payer: Self-pay

## 2019-03-29 ENCOUNTER — Ambulatory Visit: Payer: Self-pay | Admitting: Gerontology

## 2019-03-29 VITALS — BP 112/68 | Temp 97.9°F | Ht 71.0 in | Wt 162.0 lb

## 2019-03-29 DIAGNOSIS — H539 Unspecified visual disturbance: Secondary | ICD-10-CM

## 2019-03-29 DIAGNOSIS — Z0000 Encounter for general adult medical examination without abnormal findings: Secondary | ICD-10-CM

## 2019-03-29 DIAGNOSIS — R202 Paresthesia of skin: Secondary | ICD-10-CM

## 2019-03-29 MED ORDER — GABAPENTIN 300 MG PO CAPS: 300.0000 mg | ORAL_CAPSULE | Freq: Three times a day (TID) | ORAL | 1 refills | Status: DC

## 2019-03-29 NOTE — Patient Instructions (Signed)
Paresthesia Paresthesia is a burning or prickling feeling. This feeling can happen in any part of the body. It often happens in the hands, arms, legs, or feet. Usually, it is not painful. In most cases, the feeling goes away in a short time and is not a sign of a serious problem. If you have paresthesia that lasts a long time, you may need to be seen by your doctor. Follow these instructions at home: Alcohol use   Do not drink alcohol if: ? Your doctor tells you not to drink. ? You are pregnant, may be pregnant, or are planning to become pregnant.  If you drink alcohol: ? Limit how much you use to:  0-1 drink a day for women.  0-2 drinks a day for men. ? Be aware of how much alcohol is in your drink. In the [place] one drink equals one 12 oz bottle of beer (355 mL), one 5 oz glass of wine (148 mL), or one 1 oz glass of hard liquor (44 mL). Nutrition   Eat a healthy diet. This includes: ? Eating foods that have a lot of fiber in them, such as fresh fruits and vegetables, whole grains, and beans. ? Limiting foods that have a lot of fat and processed sugars in them, such as fried or sweet foods. General instructions  Take over-the-counter and prescription medicines only as told by your doctor.  Do not use any products that have nicotine or tobacco in them, such as cigarettes and e-cigarettes. If you need help quitting, ask your doctor.  If you have diabetes, work with your doctor to make sure your blood sugar stays in a healthy range.  If your feet feel numb: ? Check for redness, warmth, and swelling every day. ? Wear padded socks and comfortable shoes. These help protect your feet.  Keep all follow-up visits as told by your doctor. This is important. Contact a doctor if:  You have paresthesia that gets worse or does not go away.  Your burning or prickling feeling gets worse when you walk.  You have pain or cramps.  You feel dizzy.  You have a rash. Get help right away if  you:  Feel weak.  Have trouble walking or moving.  Have problems speaking, understanding, or seeing.  Feel confused.  Cannot control when you pee (urinate) or poop (have a bowel movement).  Lose feeling (have numbness) after an injury.  Have new weakness in an arm or leg.  Pass out (faint). Summary  Paresthesia is a burning or prickling feeling. It often happens in the hands, arms, legs, or feet.  In most cases, the feeling goes away in a short time and is not a sign of a serious problem.  If you have paresthesia that lasts a long time, you may need to be seen by your doctor. This information is not intended to replace advice given to you by your health care provider. Make sure you discuss any questions you have with your health care provider. Document Released: 06/10/2008 Document Revised: 07/24/2018 Document Reviewed: 07/07/2017 Elsevier Patient Education  2020 Elsevier Inc[employer]  

## 2019-03-29 NOTE — Progress Notes (Signed)
Established Patient Office Visit  Subjective:  Patient ID: Andrew Long, male    DOB: [birth year]  Age: 38 y.o. MRN: [id number]  CC:  Chief Complaint  Patient presents with  . Hand Pain    pain and tingling in both hands, R worse than L, not sleeping well because of pain, gabapentin not helping    HPI Andrew Long presents for follow up of paresthesia to his hands. He states that taking 100 mg Gabapentin did not relieve his symptoms. He continues to experience constant numbness, and 8/10 tingling pain to bilateral hands, right is worst than left. 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 He states that the tingling sensation radiates from his right thumb to his elbow when using his right hand. He continues to drop things, unable to make a fist and tingling sensation wakes him up at night . He denies injury, motor weakness, chest pain, fever and chills. He states that he's having blurry vision and unable to read, he has not had an eye exam. He states that he's doing well, requests influenza vaccine and offers no further concern.  Past Medical History:  Diagnosis Date  . Allergy   . Depression   . Patient denies medical problems 12/05/14   denies    Past Surgical History:  Procedure Laterality Date  . denies     denies surgeries  . LESION EXCISION N/A 12/25/2018   Procedure: EXCISION SCALP LESION RIGHT;  Surgeon: [name], MD;  Location: [facility] ORS;  Service: General;  Laterality: N/A;    Family History  Problem Relation Age of Onset  . Heart failure Mother   . Diabetes Mother     Social History   Socioeconomic History  . Marital status: Single    Spouse name: Not on file  . Number of children: 2  . Years of education: Not on file  . Highest education level: Not on file  Occupational History  . Not on file  Social Needs  . Financial resource strain: Not on file  .  Food insecurity    Worry: Not on file    Inability: Not on file  . Transportation needs    Medical: Not on file    Non-medical: Not on file  Tobacco Use  . Smoking status: Current Every Day Smoker    Packs/day: 0.30    Types: Cigarettes  . Smokeless tobacco: Never Used  Substance and Sexual Activity  . Alcohol use: Not Currently    Alcohol/week: 28.0 standard drinks    Types: 14 Cans of beer, 14 Standard drinks or equivalent per week    Comment: pt states "a lot. whatever I can get."  . Drug use: Not Currently    Types: Cocaine    Comment: not currently  . Sexual activity: Yes    Birth control/protection: Condom  Lifestyle  . Physical activity    Days per week: Not on file    Minutes per session: Not on file  . Stress: Not on file  Relationships  . Social Herbalist on phone: Not on file    Gets together: Not on file    Attends religious service: Not on file    Active member of club or organization: Not on file    Attends meetings of clubs or organizations: Not on file    Relationship status: Not on  file  . Intimate partner violence    Fear of current or ex partner: Not on file    Emotionally abused: Not on file    Physically abused: Not on file    Forced sexual activity: Not on file  Other Topics Concern  . Not on file  Social History Narrative  . Not on file    Outpatient Medications Prior to Visit  Medication Sig Dispense Refill  . citalopram (CELEXA) 20 MG tablet Take 20 mg by mouth daily.    . Multiple Vitamin (MULTIVITAMIN WITH MINERALS) TABS tablet Take 1 tablet by mouth daily.    Marland Kitchen. gabapentin (NEURONTIN) 100 MG capsule TAKE ONE CAPSULE BY MOUTH 2 TIMES A DAY 60 capsule 0   No facility-administered medications prior to visit.     Allergies  Allergen Reactions  . Tramadol Nausea And Vomiting    ROS Review of Systems  Constitutional: Negative.   Eyes: Positive for visual disturbance.  Respiratory: Negative.   Cardiovascular: Negative.    Musculoskeletal: Negative.   Skin: Negative.   Neurological: Positive for numbness.  Psychiatric/Behavioral: Negative.       Objective:    Physical Exam  Constitutional: He is oriented to person, place, and time. He appears well-developed and well-nourished.  HENT:  Head: Normocephalic and atraumatic.  Eyes: Pupils are equal, round, and reactive to light. EOM are normal.  Neck: Normal range of motion.  Cardiovascular: Normal rate and regular rhythm.  Pulmonary/Chest: Effort normal and breath sounds normal.  Musculoskeletal:        General: Tenderness (positive Tinel and Phelan test) present.  Neurological: He is alert and oriented to person, place, and time. He has normal reflexes.  Skin: Skin is warm and dry.  Psychiatric: He has a normal mood and affect. His behavior is normal. Judgment and thought content normal.    BP 112/68 (BP Location: Right Arm, Patient Position: Sitting)   Temp 97.9 F (36.6 C)   Ht 5\' 11"  (1.803 m)   Wt 162 lb (73.5 kg)   BMI 22.59 kg/m  Wt Readings from Last 3 Encounters:  03/29/19 162 lb (73.5 kg)  03/01/19 158 lb (71.7 kg)  02/18/19 154 lb (69.9 kg)     Health Maintenance Due  Topic Date Due  . HIV Screening  04/23/1998  . TETANUS/TDAP  04/23/2002  . INFLUENZA VACCINE  02/10/2019    There are no preventive care reminders to display for this patient.  Lab Results  Component Value Date   TSH 0.709 09/21/2018   Lab Results  Component Value Date   WBC 6.7 02/18/2019   HGB 14.3 02/18/2019   HCT 43.9 02/18/2019   MCV 86.6 02/18/2019   PLT 212 02/18/2019   Lab Results  Component Value Date   NA 140 02/18/2019   K 3.7 02/18/2019   CO2 26 02/18/2019   GLUCOSE 116 (H) 02/18/2019   BUN 11 02/18/2019   CREATININE 0.74 02/18/2019   BILITOT 0.7 02/18/2019   ALKPHOS 58 02/18/2019   AST 18 02/18/2019   ALT 15 02/18/2019   PROT 7.0 02/18/2019   ALBUMIN 4.2 02/18/2019   CALCIUM 9.4 02/18/2019   ANIONGAP 9 02/18/2019   Lab  Results  Component Value Date   CHOL 145 09/21/2018   Lab Results  Component Value Date   HDL 38 (L) 09/21/2018   Lab Results  Component Value Date   LDLCALC 62 09/21/2018   Lab Results  Component Value Date   TRIG 226 (H) 09/21/2018  Lab Results  Component Value Date   CHOLHDL 3.8 09/21/2018   Lab Results  Component Value Date   HGBA1C 5.6 09/21/2018      Assessment & Plan:     1. Paresthesia of both hands - Positive Tinel's and Phelan's test. -  Will increase gabapentin (NEURONTIN) 300 MG capsule; Take 1 capsule (300 mg total) by mouth 3 (three) times daily.  Dispense: 90 capsule; Refill: 1 - He was encouraged to complete charity care application for Ambulatory referral to Neurology - B12 and Folate Panel; Future  2. Health care maintenance He received - Flu Vaccine QUAD 6+ mos PF IM (Fluarix Quad PF)  3. Visual disturbance - He was encouraged to complete charity care application for - Ambulatory referral to Ophthalmology   Follow-up: Return in about 2 months (around 05/29/2019), or if symptoms worsen or fail to improve.    Martavious Hartel Trellis Paganini, NP

## 2019-06-04 ENCOUNTER — Other Ambulatory Visit: Payer: Self-pay | Admitting: Urology

## 2019-06-04 MED ORDER — GABAPENTIN 400 MG PO CAPS: 400.0000 mg | ORAL_CAPSULE | Freq: Three times a day (TID) | ORAL | 0 refills | Status: DC

## 2019-07-12 ENCOUNTER — Emergency Department
Admission: EM | Admit: 2019-07-12 | Discharge: 2019-07-12 | Disposition: A | Discharge status: Home / Self Care | Payer: Self-pay | Attending: Emergency Medicine | Admitting: Emergency Medicine

## 2019-07-12 ENCOUNTER — Emergency Department: Payer: Self-pay

## 2019-07-12 ENCOUNTER — Other Ambulatory Visit: Payer: Self-pay

## 2019-07-12 DIAGNOSIS — Y929 Unspecified place or not applicable: Secondary | ICD-10-CM | POA: Insufficient documentation

## 2019-07-12 DIAGNOSIS — F17210 Nicotine dependence, cigarettes, uncomplicated: Secondary | ICD-10-CM | POA: Insufficient documentation

## 2019-07-12 DIAGNOSIS — W2209XA Striking against other stationary object, initial encounter: Secondary | ICD-10-CM | POA: Insufficient documentation

## 2019-07-12 DIAGNOSIS — S60221A Contusion of right hand, initial encounter: Secondary | ICD-10-CM | POA: Insufficient documentation

## 2019-07-12 DIAGNOSIS — Z79899 Other long term (current) drug therapy: Secondary | ICD-10-CM | POA: Insufficient documentation

## 2019-07-12 DIAGNOSIS — Y999 Unspecified external cause status: Secondary | ICD-10-CM | POA: Insufficient documentation

## 2019-07-12 DIAGNOSIS — Y939 Activity, unspecified: Secondary | ICD-10-CM | POA: Insufficient documentation

## 2019-07-12 MED ORDER — OXYCODONE-ACETAMINOPHEN 7.5-325 MG PO TABS: 1.0000 | ORAL_TABLET | Freq: Four times a day (QID) | ORAL | 0 refills | Status: AC | PRN

## 2019-07-12 MED ORDER — IBUPROFEN 600 MG PO TABS: 600.0000 mg | ORAL_TABLET | Freq: Three times a day (TID) | ORAL | 0 refills | Status: DC | PRN

## 2019-07-12 NOTE — Discharge Instructions (Addendum)
Follow discharge care instruction take medication as directed. °

## 2019-07-12 NOTE — ED Triage Notes (Signed)
Pt states he injured his right hand last night when he punched a door.

## 2019-07-12 NOTE — ED Notes (Signed)
See triage note  Presents with right hand pain   States he became upset and punched a wall..swelling noted  Good pulses

## 2019-07-12 NOTE — ED Provider Notes (Signed)
[facility] Emergency Department Provider Note   ____________________________________________   First MD Initiated Contact with Patient 07/12/19 [phone]     (approximate)  I have reviewed the triage vital signs and the nursing notes.   HISTORY  Chief Complaint Hand Injury    HPI Andrew Long is a 38 y.o. male patient complain of right hand pain secondary to punching a door last night.  Patient rates pain as a 10/10.  Patient describes pain as "achy".  Patient denies loss of sensation.  Patient is right-hand dominant.  No palliative measure for complaint.         Past Medical History:  Diagnosis Date  . Allergy   . Depression   . Patient denies medical problems 12/05/14   denies    Patient Active Problem List   Diagnosis Date Noted  . Visual disturbance 03/29/2019  . Paresthesia of both hands 03/01/2019  . Scalp cyst 10/27/2018  . Healthcare maintenance 10/19/2018    Past Surgical History:  Procedure Laterality Date  . denies     denies surgeries  . LESION EXCISION N/A 12/25/2018   Procedure: EXCISION SCALP LESION RIGHT;  Surgeon: Henrene DodgePiscoya, Jose, MD;  Location: [facility] ORS;  Service: General;  Laterality: N/A;    Prior to Admission medications   Medication Sig Start Date End Date Taking? Authorizing Provider  citalopram (CELEXA) 20 MG tablet Take 20 mg by mouth daily.    [provider]  gabapentin (NEURONTIN) 400 MG capsule Take 1 capsule (400 mg total) by mouth 3 (three) times daily. 06/04/19   Michiel CowboyMcGowan, Shannon A, PA-C  ibuprofen (ADVIL) 600 MG tablet Take 1 tablet (600 mg total) by mouth every 8 (eight) hours as needed. 07/12/19   Joni ReiningSmith, Aime K, PA-C  Multiple Vitamin (MULTIVITAMIN WITH MINERALS) TABS tablet Take 1 tablet by mouth daily.    [provider]  oxyCODONE-acetaminophen (PERCOCET) 7.5-325 MG tablet Take 1 tablet by mouth every 6 (six) hours as needed for up to 3 days. 07/12/19 07/15/19  Joni ReiningSmith, Hosea K, PA-C     Allergies Tramadol  Family History  Problem Relation Age of Onset  . Heart failure Mother   . Diabetes Mother     Social History Social History   Tobacco Use  . Smoking status: Current Every Day Smoker    Packs/day: 0.30    Types: Cigarettes  . Smokeless tobacco: Never Used  Substance Use Topics  . Alcohol use: Not Currently    Alcohol/week: 28.0 standard drinks    Types: 14 Cans of beer, 14 Standard drinks or equivalent per week    Comment: pt states "a lot. whatever I can get."  . Drug use: Not Currently    Types: Cocaine    Comment: not currently    Review of Systems Constitutional: No fever/chills Eyes: No visual changes. ENT: No sore throat. Cardiovascular: Denies chest pain. Respiratory: Denies shortness of breath. Gastrointestinal: No abdominal pain.  No nausea, no vomiting.  No diarrhea.  No constipation. Genitourinary: Negative for dysuria. Musculoskeletal: Right hand pain.   Skin: Negative for rash. Neurological: Negative for headaches, focal weakness or numbness. Psychiatric: Depression  Allergic/Immunilogical: Tramadol ____________________________________________   PHYSICAL EXAM:  VITAL SIGNS: ED Triage Vitals  Enc Vitals Group     BP 07/12/19 0853 128/81     Pulse Rate 07/12/19 0853 (!) 103     Resp 07/12/19 0853 17     Temp 07/12/19 0855 98.3 F (36.8 C)     Temp Source  07/12/19 0853 Oral     SpO2 07/12/19 0853 99 %     Weight 07/12/19 0853 165 lb (74.8 kg)     Height 07/12/19 0853 5\' 11"  (1.803 m)     Head Circumference --      Peak Flow --      Pain Score 07/12/19 0853 10     Pain Loc --      Pain Edu? --      Excl. in GC? --    Constitutional: Alert and oriented. Well appearing and in no acute distress. Cardiovascular: Normal rate, regular rhythm. Grossly normal heart sounds.  Good peripheral circulation. Respiratory: Normal respiratory effort.  No retractions. Lungs CTAB. Musculoskeletal: No lower extremity tenderness nor  edema.  No joint effusions. Neurologic:  Normal speech and language. No gross focal neurologic deficits are appreciated. No gait instability. Skin:  Skin is warm, dry and intact. No rash noted. Psychiatric: Mood and affect are normal. Speech and behavior are normal.  ____________________________________________   LABS (all labs ordered are listed, but only abnormal results are displayed)  Labs Reviewed - No data to display ____________________________________________  EKG   ____________________________________________  RADIOLOGY  ED MD interpretation:    Official radiology report(s): DG Hand Complete Right  Result Date: 07/12/2019 CLINICAL DATA:  Punched a wall, pain EXAM: RIGHT HAND - COMPLETE 3+ VIEW COMPARISON:  12/27/2017 FINDINGS: No acute fracture or dislocation of the right hand. There is a chronic, angulated fracture deformity of the distal right fifth metacarpal, unchanged compared to prior examination. Joint spaces are well preserved. Soft tissues are unremarkable. IMPRESSION: No acute fracture or dislocation of the right hand. There is a chronic, angulated fracture deformity of the distal right fifth metacarpal. Electronically Signed   By: 12/29/2017 M.D.   On: 07/12/2019 09:41    ____________________________________________   PROCEDURES  Procedure(s) performed (including Critical Care):  Procedures   ____________________________________________   INITIAL IMPRESSION / ASSESSMENT AND PLAN / ED COURSE  As part of my medical decision making, I reviewed the following data within the electronic MEDICAL RECORD NUMBER     Patient presents with right hand pain secondary striking a door yesterday.  Discussed neck x-ray findings with patient.  Patient feels exam is consistent with right hand contusion.  Patient given discharge care instruction advised take medication as directed.  Patient advised follow-up PCP.    AZUL BRUMETT was evaluated in Emergency Department on  07/12/2019 for the symptoms described in the history of present illness. He was evaluated in the context of the global COVID-19 pandemic, which necessitated consideration that the patient might be at risk for infection with the SARS-CoV-2 virus that causes COVID-19. Institutional protocols and algorithms that pertain to the evaluation of patients at risk for COVID-19 are in a state of rapid change based on information released by regulatory bodies including the CDC and federal and state organizations. These policies and algorithms were followed during the patient's care in the ED.       ____________________________________________   FINAL CLINICAL IMPRESSION(S) / ED DIAGNOSES  Final diagnoses:  Contusion of right hand, initial encounter     ED Discharge Orders         Ordered    oxyCODONE-acetaminophen (PERCOCET) 7.5-325 MG tablet  Every 6 hours PRN     07/12/19 1009    ibuprofen (ADVIL) 600 MG tablet  Every 8 hours PRN     07/12/19 1009           Note:  This document was prepared using Dragon voice recognition software and may include unintentional dictation errors.    Junior, Huezo, PA-C 07/12/19 1011    Duffy Bruce, MD 07/12/19 [phone]

## 2019-09-06 ENCOUNTER — Ambulatory Visit: Payer: Self-pay | Admitting: Gerontology

## 2019-09-06 ENCOUNTER — Encounter: Payer: Self-pay | Admitting: Gerontology

## 2019-09-06 ENCOUNTER — Other Ambulatory Visit: Payer: Self-pay

## 2019-09-06 DIAGNOSIS — H6091 Unspecified otitis externa, right ear: Secondary | ICD-10-CM | POA: Insufficient documentation

## 2019-09-06 DIAGNOSIS — K219 Gastro-esophageal reflux disease without esophagitis: Secondary | ICD-10-CM

## 2019-09-06 DIAGNOSIS — R202 Paresthesia of skin: Secondary | ICD-10-CM

## 2019-09-06 HISTORY — DX: Gastro-esophageal reflux disease without esophagitis: K21.9

## 2019-09-06 MED ORDER — OMEPRAZOLE 20 MG PO CPDR: 20.0000 mg | DELAYED_RELEASE_CAPSULE | Freq: Every day | ORAL | 0 refills | Status: DC

## 2019-09-06 MED ORDER — NEOMYCIN-POLYMYXIN-HC 3.5-10000-1 OT SOLN: 4.0000 [drp] | Freq: Four times a day (QID) | OTIC | 0 refills | Status: DC

## 2019-09-06 MED ORDER — IBUPROFEN 600 MG PO TABS: 600.0000 mg | ORAL_TABLET | Freq: Three times a day (TID) | ORAL | 0 refills | Status: DC | PRN

## 2019-09-06 NOTE — Progress Notes (Signed)
Established Patient Office Visit  Subjective:  Patient ID: Andrew Long, male    DOB: [birth year]  Age: 39 y.o. MRN: [id number]  CC: No chief complaint on file. Patient consents to telephone visit and 2 patient identifiers was used to identify patient.  HPI Andrew Long presents for c/o of acid reflux, ear pain and medication refill. He states that he has been experiencing acid reflux prior to eating, and few hours after eating. He states that it has been going on for 3 months. He reports that taking otc antacids minimally relieve his symptoms. Currently he c/o right ear otalgia that has been going on for 4 days. He denies hearing loss, tinnitus, otorrhea, but he reports that it's painful lying on his right side. He states that he's being putting Qtip soaked in peroxide inside his right ear with no relief. He denies jaw pain, sinus pressure, hearing loss, headache, abdominal pain, fever and chills. Overall, he states that he's doing well and offers no further complaint.  Past Medical History:  Diagnosis Date  . Acid reflux 09/06/2019  . Allergy   . Depression   . Patient denies medical problems 12/05/14   denies    Past Surgical History:  Procedure Laterality Date  . denies     denies surgeries  . LESION EXCISION N/A 12/25/2018   Procedure: EXCISION SCALP LESION RIGHT;  Surgeon: [name], MD;  Location: [facility] ORS;  Service: General;  Laterality: N/A;    Family History  Problem Relation Age of Onset  . Heart failure Mother   . Diabetes Mother     Social History   Socioeconomic History  . Marital status: Single    Spouse name: Not on file  . Number of children: 2  . Years of education: Not on file  . Highest education level: Not on file  Occupational History  . Not on file  Tobacco Use  . Smoking status: Current Every Day Smoker    Packs/day: 0.30    Types: Cigarettes  . Smokeless tobacco: Never Used  Substance and Sexual Activity  . Alcohol use: Not Currently     Alcohol/week: 28.0 standard drinks    Types: 14 Cans of beer, 14 Standard drinks or equivalent per week    Comment: pt states "a lot. whatever I can get."  . Drug use: Not Currently    Types: Cocaine    Comment: not currently  . Sexual activity: Yes    Birth control/protection: Condom  Other Topics Concern  . Not on file  Social History Narrative  . Not on file   Social Determinants of Health   Financial Resource Strain:   . Difficulty of Paying Living Expenses: Not on file  Food Insecurity:   . Worried About Charity fundraiser in the Last Year: Not on file  . Ran Out of Food in the Last Year: Not on file  Transportation Needs:   . Lack of Transportation (Medical): Not on file  . Lack of Transportation (Non-Medical): Not on file  Physical Activity:   . Days of Exercise per Week: Not on file  . Minutes of Exercise per Session: Not on file  Stress:   . Feeling of Stress : Not on file  Social Connections:   . Frequency of Communication with Friends and Family: Not on file  . Frequency of Social Gatherings with Friends and Family: Not on file  . Attends Religious Services: Not on file  . Active Member of Clubs or  Organizations: Not on file  . Attends Banker Meetings: Not on file  . Marital Status: Not on file  Intimate Partner Violence:   . Fear of Current or Ex-Partner: Not on file  . Emotionally Abused: Not on file  . Physically Abused: Not on file  . Sexually Abused: Not on file    Outpatient Medications Prior to Visit  Medication Sig Dispense Refill  . ARIPiprazole (ABILIFY) 20 MG tablet Take 20 mg by mouth daily.    Marland Kitchen buPROPion (WELLBUTRIN XL) 150 MG 24 hr tablet Take 150 mg by mouth daily.    Marland Kitchen gabapentin (NEURONTIN) 400 MG capsule Take 1 capsule (400 mg total) by mouth 3 (three) times daily. 90 capsule 0  . Multiple Vitamin (MULTIVITAMIN WITH MINERALS) TABS tablet Take 1 tablet by mouth daily.    . citalopram (CELEXA) 20 MG tablet Take 20 mg by  mouth daily.    Marland Kitchen ibuprofen (ADVIL) 600 MG tablet Take 1 tablet (600 mg total) by mouth every 8 (eight) hours as needed. (Patient not taking: Reported on 09/06/2019) 15 tablet 0   No facility-administered medications prior to visit.    Allergies  Allergen Reactions  . Tramadol Nausea And Vomiting    ROS Review of Systems  HENT: Positive for ear pain. Negative for hearing loss, sinus pressure and sinus pain.   Respiratory: Negative.   Cardiovascular: Negative.   Gastrointestinal: Negative.   Neurological: Negative.   Psychiatric/Behavioral: Negative.       Objective:    Physical Exam No Physical exam was done There were no vitals taken for this visit. Wt Readings from Last 3 Encounters:  07/12/19 165 lb (74.8 kg)  03/29/19 162 lb (73.5 kg)  03/01/19 158 lb (71.7 kg)     Health Maintenance Due  Topic Date Due  . HIV Screening  04/23/1998  . TETANUS/TDAP  04/23/2002    There are no preventive care reminders to display for this patient.  Lab Results  Component Value Date   TSH 0.709 09/21/2018   Lab Results  Component Value Date   WBC 6.7 02/18/2019   HGB 14.3 02/18/2019   HCT 43.9 02/18/2019   MCV 86.6 02/18/2019   PLT 212 02/18/2019   Lab Results  Component Value Date   NA 140 02/18/2019   K 3.7 02/18/2019   CO2 26 02/18/2019   GLUCOSE 116 (H) 02/18/2019   BUN 11 02/18/2019   CREATININE 0.74 02/18/2019   BILITOT 0.7 02/18/2019   ALKPHOS 58 02/18/2019   AST 18 02/18/2019   ALT 15 02/18/2019   PROT 7.0 02/18/2019   ALBUMIN 4.2 02/18/2019   CALCIUM 9.4 02/18/2019   ANIONGAP 9 02/18/2019   Lab Results  Component Value Date   CHOL 145 09/21/2018   Lab Results  Component Value Date   HDL 38 (L) 09/21/2018   Lab Results  Component Value Date   LDLCALC 62 09/21/2018   Lab Results  Component Value Date   TRIG 226 (H) 09/21/2018   Lab Results  Component Value Date   CHOLHDL 3.8 09/21/2018   Lab Results  Component Value Date   HGBA1C 5.6  09/21/2018      Assessment & Plan:    1. Gastroesophageal reflux disease without esophagitis - He will start Arvil Chaco was educated on medication side effect and advised to notify clinic. --Avoid spicy, fatty and fried food -Avoid sodas and sour juices -Avoid heavy meals -Avoid eating 4 hours before bedtime -Elevate head of bed at night -  omeprazole (PRILOSEC) 20 MG capsule; Take 1 capsule (20 mg total) by mouth daily.  Dispense: 30 capsule; Refill: 0  2. Otitis externa of right ear, unspecified chronicity, unspecified type - He will be treated prophylactically for Otitis externa. he was educated on medication side effect and advised to notify clinic. - neomycin-polymyxin-hydrocortisone (CORTISPORIN) OTIC solution; Place 4 drops into the right ear 4 (four) times daily.  Dispense: 10 mL; Refill: 0  3. Paresthesia of both hands  - ibuprofen (ADVIL) 600 MG tablet; Take 1 tablet (600 mg total) by mouth every 8 (eight) hours as needed.  Dispense: 20 tablet; Refill: 0    Follow-up: Return in about 2 weeks (around 09/20/2019), or if symptoms worsen or fail to improve.    Lennell Shanks Trellis Paganini, NP

## 2019-09-20 ENCOUNTER — Ambulatory Visit: Payer: Self-pay | Admitting: Gerontology

## 2019-09-29 ENCOUNTER — Telehealth: Payer: Self-pay | Admitting: Pharmacy Technician

## 2019-09-29 NOTE — Telephone Encounter (Signed)
Patient still needs to provide 2020 Federal Tax Return.  Patient acknowledged that he understands that the requested documentation needs to be provided by 12/11/19 to prevent a halt in medication assistance from [facility]  Sherilyn Dacosta Care Manager [facility]

## 2019-12-08 ENCOUNTER — Other Ambulatory Visit: Payer: Self-pay

## 2019-12-08 ENCOUNTER — Emergency Department
Admission: EM | Admit: 2019-12-08 | Discharge: 2019-12-08 | Disposition: A | Discharge status: Home / Self Care | Payer: Self-pay | Attending: Emergency Medicine | Admitting: Emergency Medicine

## 2019-12-08 DIAGNOSIS — R112 Nausea with vomiting, unspecified: Secondary | ICD-10-CM | POA: Insufficient documentation

## 2019-12-08 DIAGNOSIS — E860 Dehydration: Secondary | ICD-10-CM | POA: Insufficient documentation

## 2019-12-08 DIAGNOSIS — F1010 Alcohol abuse, uncomplicated: Secondary | ICD-10-CM | POA: Insufficient documentation

## 2019-12-08 DIAGNOSIS — R519 Headache, unspecified: Secondary | ICD-10-CM | POA: Insufficient documentation

## 2019-12-08 LAB — LIPASE, BLOOD: Lipase: 27 U/L (ref 11–51)

## 2019-12-08 LAB — BASIC METABOLIC PANEL
Anion gap: 16 — ABNORMAL HIGH (ref 5–15)
BUN: 16 mg/dL (ref 6–20)
CO2: 24 mmol/L (ref 22–32)
Calcium: 10.4 mg/dL — ABNORMAL HIGH (ref 8.9–10.3)
Chloride: 91 mmol/L — ABNORMAL LOW (ref 98–111)
Creatinine, Ser: 1.1 mg/dL (ref 0.61–1.24)
GFR calc Af Amer: >60 mL/min (ref >60)
GFR calc non Af Amer: >60 mL/min (ref >60)
Glucose, Bld: 104 mg/dL — ABNORMAL HIGH (ref 70–99)
Potassium: 3.9 mmol/L (ref 3.5–5.1)
Sodium: 131 mmol/L — ABNORMAL LOW (ref 135–145)

## 2019-12-08 LAB — HEPATIC FUNCTION PANEL
ALT: 22 U/L (ref 0–44)
AST: 35 U/L (ref 15–41)
Albumin: 5.1 g/dL — ABNORMAL HIGH (ref 3.5–5.0)
Alkaline Phosphatase: 72 U/L (ref 38–126)
Bilirubin, Direct: 0.2 mg/dL (ref 0.0–0.2)
Indirect Bilirubin: 1.8 mg/dL — ABNORMAL HIGH (ref 0.3–0.9)
Total Bilirubin: 2 mg/dL — ABNORMAL HIGH (ref 0.3–1.2)
Total Protein: 9 g/dL — ABNORMAL HIGH (ref 6.5–8.1)

## 2019-12-08 LAB — CBC
HCT: 49 % (ref 39.0–52.0)
Hemoglobin: 16.8 g/dL (ref 13.0–17.0)
MCH: 29.6 pg (ref 26.0–34.0)
MCHC: 34.3 g/dL (ref 30.0–36.0)
MCV: 86.3 fL (ref 80.0–100.0)
Platelets: 287 10*3/uL (ref 150–400)
RBC: 5.68 MIL/uL (ref 4.22–5.81)
RDW: 14 % (ref 11.5–15.5)
WBC: 8 10*3/uL (ref 4.0–10.5)
nRBC: 0 % (ref 0.0–0.2)

## 2019-12-08 LAB — URINALYSIS, COMPLETE (UACMP) WITH MICROSCOPIC
Bacteria, UA: NONE SEEN
Bilirubin Urine: NEGATIVE
Glucose, UA: NEGATIVE mg/dL
Hgb urine dipstick: NEGATIVE
Ketones, ur: NEGATIVE mg/dL
Leukocytes,Ua: NEGATIVE
Nitrite: NEGATIVE
Protein, ur: NEGATIVE mg/dL
Specific Gravity, Urine: 1.004 — ABNORMAL LOW (ref 1.005–1.030)
pH: 6 (ref 5.0–8.0)

## 2019-12-08 MED ORDER — KETOROLAC TROMETHAMINE 30 MG/ML IJ SOLN
30.0000 mg | Freq: Once | INTRAMUSCULAR | Status: AC
  Administered 2019-12-08: 30 mg via INTRAVENOUS
  Filled 2019-12-08: qty 1

## 2019-12-08 MED ORDER — METOCLOPRAMIDE HCL 5 MG/ML IJ SOLN
10.0000 mg | Freq: Once | INTRAMUSCULAR | Status: AC
  Administered 2019-12-08: 10 mg via INTRAVENOUS
  Filled 2019-12-08: qty 2

## 2019-12-08 MED ORDER — SODIUM CHLORIDE 0.9 % IV SOLN: Freq: Once | INTRAVENOUS | Status: AC

## 2019-12-08 MED ORDER — CHLORDIAZEPOXIDE HCL 25 MG PO CAPS: ORAL_CAPSULE | ORAL | 0 refills | Status: DC

## 2019-12-08 MED ORDER — ONDANSETRON 4 MG PO TBDP
4.0000 mg | ORAL_TABLET | Freq: Three times a day (TID) | ORAL | 0 refills | Status: DC | PRN
Start: 2019-12-08 — End: 2019-12-13

## 2019-12-08 MED ORDER — LORAZEPAM 2 MG/ML IJ SOLN
1.0000 mg | Freq: Once | INTRAMUSCULAR | Status: AC
  Administered 2019-12-08: 1 mg via INTRAVENOUS
  Filled 2019-12-08: qty 1

## 2019-12-08 NOTE — ED Triage Notes (Signed)
Pt comes POV from home with n/v, weakness, dehydration, and fatigue since Wednesday. Pt tachy at 123. Unable to get food/water down for about a day.

## 2019-12-08 NOTE — ED Notes (Signed)
Pt states he is ready to go. Pt states he has a headache and has been here since noon. Pt up and walking. No deficits noted.

## 2019-12-08 NOTE — ED Notes (Signed)
Pt has taken BC powder today at 9am.

## 2019-12-08 NOTE — ED Provider Notes (Signed)
ER Provider Note       Time seen: 4:22 PM    I have reviewed the vital signs and the nursing notes.  HISTORY   Chief Complaint Weakness, Headache, and Emesis    HPI Andrew Long is a 39 y.o. male with a history of acid reflux, allergies, depression, daily alcohol use who presents today for nausea, vomiting and weakness with dehydration and fatigue since Wednesday.  Patient states he has been unable to take food or water for about 24 to 48 hours.  He denies fevers, chills, has had night sweats.  Past Medical History:  Diagnosis Date  . Acid reflux 09/06/2019  . Allergy   . Depression   . Patient denies medical problems 12/05/14   denies    Past Surgical History:  Procedure Laterality Date  . denies     denies surgeries  . LESION EXCISION N/A 12/25/2018   Procedure: EXCISION SCALP LESION RIGHT;  Surgeon: [name], MD;  Location: [facility] ORS;  Service: General;  Laterality: N/A;    Allergies Tramadol   Review of Systems Constitutional: Negative for fever.  Positive for night sweats Cardiovascular: Negative for chest pain. Respiratory: Negative for shortness of breath. Gastrointestinal: Negative for abdominal pain, vomiting and diarrhea. Musculoskeletal: Negative for back pain. Skin: Negative for rash. Neurological: Negative for headaches, focal weakness or numbness.  All systems negative/normal/unremarkable except as stated in the HPI  ____________________________________________   PHYSICAL EXAM:  VITAL SIGNS: Vitals:   12/08/19 1243  BP: (!) 147/98  Pulse: (!) 118  Resp: 18  Temp: 98.2 F (36.8 C)  SpO2: 99%    Constitutional: Alert and oriented. Well appearing and in no distress. Eyes: Conjunctivae are normal. Normal extraocular movements. ENT      Head: Normocephalic and atraumatic.      Nose: No congestion/rhinnorhea.      Mouth/Throat: Mucous membranes are moist.      Neck: No stridor. Cardiovascular: Normal rate, regular rhythm. No  murmurs, rubs, or gallops. Respiratory: Normal respiratory effort without tachypnea nor retractions. Breath sounds are clear and equal bilaterally. No wheezes/rales/rhonchi. Gastrointestinal: Soft and nontender. Normal bowel sounds Musculoskeletal: Nontender with normal range of motion in extremities. No lower extremity tenderness nor edema. Neurologic:  Normal speech and language. No gross focal neurologic deficits are appreciated.  Skin:  Skin is warm, dry and intact. No rash noted. Psychiatric: Speech and behavior are normal.  ____________________________________________  EKG: Interpreted by me.  Sinus tachycardia with a rate of 112 bpm, left ventricular hypertrophy, normal QT  ____________________________________________   LABS (pertinent positives/negatives)  Labs Reviewed  BASIC METABOLIC PANEL - Abnormal; Notable for the following components:      Result Value   Sodium 131 (*)    Chloride 91 (*)    Glucose, Bld 104 (*)    Calcium 10.4 (*)    Anion gap 16 (*)    All other components within normal limits  HEPATIC FUNCTION PANEL - Abnormal; Notable for the following components:   Total Protein 9.0 (*)    Albumin 5.1 (*)    Total Bilirubin 2.0 (*)    Indirect Bilirubin 1.8 (*)    All other components within normal limits  CBC  LIPASE, BLOOD  URINALYSIS, COMPLETE (UACMP) WITH MICROSCOPIC  CBG MONITORING, ED   DIFFERENTIAL DIAGNOSIS  Dehydration, electrolyte abnormality, withdrawal, occult CVA, occult infection, alcoholic ketoacidosis  ASSESSMENT AND PLAN  Dehydration, alcohol use disorder   Plan: The patient had presented for vomiting, weakness and dehydration. Patient's  labs did indicate dehydration this is likely multifactorial and involves daily alcohol abuse.  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  Patient be discharged with antiemetics and a Librium taper.  Daryel November MD    Note: This note was  generated in part or whole with voice recognition software. Voice recognition is usually quite accurate but there are transcription errors that can and very often do occur. I apologize for any typographical errors that were not detected and corrected.     Emily Filbert, MD 12/08/19 [facility]

## 2019-12-13 ENCOUNTER — Encounter: Payer: Self-pay | Admitting: Gerontology

## 2019-12-13 ENCOUNTER — Ambulatory Visit: Payer: Self-pay | Admitting: Gerontology

## 2019-12-13 ENCOUNTER — Other Ambulatory Visit: Payer: Self-pay

## 2019-12-13 VITALS — BP 141/78 | HR 103 | Ht 71.0 in | Wt 158.0 lb

## 2019-12-13 DIAGNOSIS — R202 Paresthesia of skin: Secondary | ICD-10-CM

## 2019-12-13 DIAGNOSIS — I10 Essential (primary) hypertension: Secondary | ICD-10-CM

## 2019-12-13 DIAGNOSIS — K219 Gastro-esophageal reflux disease without esophagitis: Secondary | ICD-10-CM

## 2019-12-13 HISTORY — DX: Essential (primary) hypertension: I10

## 2019-12-13 MED ORDER — AMLODIPINE BESYLATE 2.5 MG PO TABS: 2.5000 mg | ORAL_TABLET | Freq: Every day | ORAL | 0 refills | Status: DC

## 2019-12-13 MED ORDER — OMEPRAZOLE 20 MG PO CPDR: 20.0000 mg | DELAYED_RELEASE_CAPSULE | Freq: Every day | ORAL | 3 refills | Status: DC

## 2019-12-13 MED ORDER — GABAPENTIN 400 MG PO CAPS: 400.0000 mg | ORAL_CAPSULE | Freq: Every day | ORAL | 1 refills | Status: DC

## 2019-12-13 NOTE — Patient Instructions (Signed)
Food Choices for Gastroesophageal Reflux Disease, Adult When you have gastroesophageal reflux disease (GERD), the foods you eat and your eating habits are very important. Choosing the right foods can help ease the discomfort of GERD. Consider working with a diet and nutrition specialist (dietitian) to help you make healthy food choices. What general guidelines should I follow?  Eating plan  Choose healthy foods low in fat, such as fruits, vegetables, whole grains, low-fat dairy products, and lean meat, fish, and poultry.  Eat frequent, small meals instead of three large meals each day. Eat your meals slowly, in a relaxed setting. Avoid bending over or lying down until 2-3 hours after eating.  Limit high-fat foods such as fatty meats or fried foods.  Limit your intake of oils, butter, and shortening to less than 8 teaspoons each day.  Avoid the following: ? Foods that cause symptoms. These may be different for different people. Keep a food diary to keep track of foods that cause symptoms. ? Alcohol. ? Drinking large amounts of liquid with meals. ? Eating meals during the 2-3 hours before bed.  Cook foods using methods other than frying. This may include baking, grilling, or broiling. Lifestyle  Maintain a healthy weight. Ask your health care provider what weight is healthy for you. If you need to lose weight, work with your health care provider to do so safely.  Exercise for at least 30 minutes on 5 or more days each week, or as told by your health care provider.  Avoid wearing clothes that fit tightly around your waist and chest.  Do not use any products that contain nicotine or tobacco, such as cigarettes and e-cigarettes. If you need help quitting, ask your health care provider.  Sleep with the head of your bed raised. Use a wedge under the mattress or blocks under the bed frame to raise the head of the bed. What foods are not recommended? The items listed may not be a complete  list. Talk with your dietitian about what dietary choices are best for you. Grains Pastries or quick breads with added fat. French toast. Vegetables Deep fried vegetables. French fries. Any vegetables prepared with added fat. Any vegetables that cause symptoms. For some people this may include tomatoes and tomato products, chili peppers, onions and garlic, and horseradish. Fruits Any fruits prepared with added fat. Any fruits that cause symptoms. For some people this may include citrus fruits, such as oranges, grapefruit, pineapple, and lemons. Meats and other protein foods High-fat meats, such as fatty beef or pork, hot dogs, ribs, ham, sausage, salami and bacon. Fried meat or protein, including fried fish and fried chicken. Nuts and nut butters. Dairy Whole milk and chocolate milk. Sour cream. Cream. Ice cream. Cream cheese. Milk shakes. Beverages Coffee and tea, with or without caffeine. Carbonated beverages. Sodas. Energy drinks. Fruit juice made with acidic fruits (such as orange or grapefruit). Tomato juice. Alcoholic drinks. Fats and oils Butter. Margarine. Shortening. Ghee. Sweets and desserts Chocolate and cocoa. Donuts. Seasoning and other foods Pepper. Peppermint and spearmint. Any condiments, herbs, or seasonings that cause symptoms. For some people, this may include curry, hot sauce, or vinegar-based salad dressings. Summary  When you have gastroesophageal reflux disease (GERD), food and lifestyle choices are very important to help ease the discomfort of GERD.  Eat frequent, small meals instead of three large meals each day. Eat your meals slowly, in a relaxed setting. Avoid bending over or lying down until 2-3 hours after eating.  Limit high-fat   foods such as fatty meat or fried foods. This information is not intended to replace advice given to you by your health care provider. Make sure you discuss any questions you have with your health care provider. Document Revised:  10/19/2018 Document Reviewed: 06/29/2016 Elsevier Patient Education  [place] Your Hypertension Hypertension is commonly called high blood pressure. This is when the force of your blood pressing against the walls of your arteries is too strong. Arteries are blood vessels that carry blood from your heart throughout your body. Hypertension forces the heart to work harder to pump blood, and may cause the arteries to become narrow or stiff. Having untreated or uncontrolled hypertension can cause heart attack, stroke, kidney disease, and other problems. What are blood pressure readings? A blood pressure reading consists of a higher number over a lower number. Ideally, your blood pressure should be below 120/80. The first ("top") number is called the systolic pressure. It is a measure of the pressure in your arteries as your heart beats. The second ("bottom") number is called the diastolic pressure. It is a measure of the pressure in your arteries as the heart relaxes. What does my blood pressure reading mean? Blood pressure is classified into four stages. Based on your blood pressure reading, your health care provider may use the following stages to determine what type of treatment you need, if any. Systolic pressure and diastolic pressure are measured in a unit called mm Hg. Normal  Systolic pressure: below 096.  Diastolic pressure: below 80. Elevated  Systolic pressure: 045-409.  Diastolic pressure: below 80. Hypertension stage 1  Systolic pressure: 811-914.  Diastolic pressure: 78-29. Hypertension stage 2  Systolic pressure: 562 or above.  Diastolic pressure: 90 or above. What health risks are associated with hypertension? Managing your hypertension is an important responsibility. Uncontrolled hypertension can lead to:  A heart attack.  A stroke.  A weakened blood vessel (aneurysm).  Heart failure.  Kidney damage.  Eye damage.  Metabolic  syndrome.  Memory and concentration problems. What changes can I make to manage my hypertension? Hypertension can be managed by making lifestyle changes and possibly by taking medicines. Your health care provider will help you make a plan to bring your blood pressure within a normal range. Eating and drinking   Eat a diet that is high in fiber and potassium, and low in salt (sodium), added sugar, and fat. An example eating plan is called the DASH (Dietary Approaches to Stop Hypertension) diet. To eat this way: ? Eat plenty of fresh fruits and vegetables. Try to fill half of your plate at each meal with fruits and vegetables. ? Eat whole grains, such as whole wheat pasta, brown rice, or whole grain bread. Fill about one quarter of your plate with whole grains. ? Eat low-fat diary products. ? Avoid fatty cuts of meat, processed or cured meats, and poultry with skin. Fill about one quarter of your plate with lean proteins such as fish, chicken without skin, beans, eggs, and tofu. ? Avoid premade and processed foods. These tend to be higher in sodium, added sugar, and fat.  Reduce your daily sodium intake. Most people with hypertension should eat less than 1,500 mg of sodium a day.  Limit alcohol intake to no more than 1 drink a day for nonpregnant women and 2 drinks a day for men. One drink equals 12 oz of beer, 5 oz of wine, or 1 oz of hard liquor. Lifestyle  Work with your health  care provider to maintain a healthy body weight, or to lose weight. Ask what an ideal weight is for you.  Get at least 30 minutes of exercise that causes your heart to beat faster (aerobic exercise) most days of the week. Activities may include walking, swimming, or biking.  Include exercise to strengthen your muscles (resistance exercise), such as weight lifting, as part of your weekly exercise routine. Try to do these types of exercises for 30 minutes at least 3 days a week.  Do not use any products that contain  nicotine or tobacco, such as cigarettes and e-cigarettes. If you need help quitting, ask your health care provider.  Control any long-term (chronic) conditions you have, such as high cholesterol or diabetes. Monitoring  Monitor your blood pressure at home as told by your health care provider. Your personal target blood pressure may vary depending on your medical conditions, your age, and other factors.  Have your blood pressure checked regularly, as often as told by your health care provider. Working with your health care provider  Review all the medicines you take with your health care provider because there may be side effects or interactions.  Talk with your health care provider about your diet, exercise habits, and other lifestyle factors that may be contributing to hypertension.  Visit your health care provider regularly. Your health care provider can help you create and adjust your plan for managing hypertension. Will I need medicine to control my blood pressure? Your health care provider may prescribe medicine if lifestyle changes are not enough to get your blood pressure under control, and if:  Your systolic blood pressure is 130 or higher.  Your diastolic blood pressure is 80 or higher. Take medicines only as told by your health care provider. Follow the directions carefully. Blood pressure medicines must be taken as prescribed. The medicine does not work as well when you skip doses. Skipping doses also puts you at risk for problems. Contact a health care provider if:  You think you are having a reaction to medicines you have taken.  You have repeated (recurrent) headaches.  You feel dizzy.  You have swelling in your ankles.  You have trouble with your vision. Get help right away if:  You develop a severe headache or confusion.  You have unusual weakness or numbness, or you feel faint.  You have severe pain in your chest or abdomen.  You vomit repeatedly.  You have  trouble breathing. Summary  Hypertension is when the force of blood pumping through your arteries is too strong. If this condition is not controlled, it may put you at risk for serious complications.  Your personal target blood pressure may vary depending on your medical conditions, your age, and other factors. For most people, a normal blood pressure is less than 120/80.  Hypertension is managed by lifestyle changes, medicines, or both. Lifestyle changes include weight loss, eating a healthy, low-sodium diet, exercising more, and limiting alcohol. This information is not intended to replace advice given to you by your health care provider. Make sure you discuss any questions you have with your health care provider. Document Revised: 10/20/2018 Document Reviewed: 05/26/2016 Elsevier Patient Education  2020 [employer].

## 2019-12-13 NOTE — Progress Notes (Signed)
Established Patient Office Visit  Subjective:  Patient ID: Andrew Long, male    DOB: 11-23-1980  Age: 39 y.o. MRN: [id number]  CC:  Chief Complaint  Patient presents with  . hospital followup    worried about BP; Headache on R side of head, R nostril/sinuses clogging up and pain behind R eye    HPI Andrew Long presents for follow up of Genella Rife and medication refill. He was seen at the ED on 12/08/2019 for c/o nausea, vomiting, weakness, daily alcohol use and dehydration. His EKG done during visit showed left ventricular hypertrophy and sinus tachycardia. He was treated with antiemetic and Librium taper. Currently, he denies nausea , vomiting. He reports that he started drinking  2-3 sixteen ounce beer daily, denies withdrawal symptoms and states that he is in control and doesn't need counseling. He states that he stopped going to [facility] eight months ago for substance abuse counseling. He continues to smoke 1 pack of cigarette 3-4 days and denies the desire to quit. He states that his acid reflux is under control with taking Omeprazole. He also reports that recently, his blood pressure has been elevated, but he denies headache, vision changes, chest pain and palpitation. Overall, he states that he's doing well and offers no further complaint.  Past Medical History:  Diagnosis Date  . Acid reflux 09/06/2019  . Allergy   . Depression   . Essential hypertension 12/13/2019  . Patient denies medical problems 12/05/14   denies    Past Surgical History:  Procedure Laterality Date  . denies     denies surgeries  . LESION EXCISION N/A 12/25/2018   Procedure: EXCISION SCALP LESION RIGHT;  Surgeon: Henrene Dodge, MD;  Location: [facility] ORS;  Service: General;  Laterality: N/A;    Family History  Problem Relation Age of Onset  . Heart failure Mother   . Diabetes Mother     Social History   Socioeconomic History  . Marital status: Single    Spouse name: Not on file  . Number of children: 2   . Years of education: Not on file  . Highest education level: Not on file  Occupational History  . Not on file  Tobacco Use  . Smoking status: Current Every Day Smoker    Packs/day: 0.30    Types: Cigarettes  . Smokeless tobacco: Never Used  Substance and Sexual Activity  . Alcohol use: Not Currently    Alcohol/week: 28.0 standard drinks    Types: 14 Cans of beer, 14 Standard drinks or equivalent per week    Comment: pt states "a lot. whatever I can get."  . Drug use: Not Currently    Types: Cocaine    Comment: not currently  . Sexual activity: Yes    Birth control/protection: Condom  Other Topics Concern  . Not on file  Social History Narrative  . Not on file   Social Determinants of Health   Financial Resource Strain:   . Difficulty of Paying Living Expenses:   Food Insecurity:   . Worried About Programme researcher, broadcasting/film/video in the Last Year:   . Barista in the Last Year:   Transportation Needs:   . Freight forwarder (Medical):   Marland Kitchen Lack of Transportation (Non-Medical):   Physical Activity:   . Days of Exercise per Week:   . Minutes of Exercise per Session:   Stress:   . Feeling of Stress :   Social Connections:   . Frequency of  Communication with Friends and Family:   . Frequency of Social Gatherings with Friends and Family:   . Attends Religious Services:   . Active Member of Clubs or Organizations:   . Attends Banker Meetings:   Marland Kitchen Marital Status:   Intimate Partner Violence:   . Fear of Current or Ex-Partner:   . Emotionally Abused:   Marland Kitchen Physically Abused:   . Sexually Abused:     Outpatient Medications Prior to Visit  Medication Sig Dispense Refill  . chlordiazePOXIDE (LIBRIUM) 25 MG capsule Take 1 tablet by mouth 5 times a day on day 1, then decrease by 1 tablet daily until gone. 15 capsule 0  . Multiple Vitamin (MULTIVITAMIN WITH MINERALS) TABS tablet Take 1 tablet by mouth daily.    Marland Kitchen gabapentin (NEURONTIN) 400 MG capsule Take 1  capsule (400 mg total) by mouth 3 (three) times daily. 90 capsule 0  . ibuprofen (ADVIL) 600 MG tablet Take 1 tablet (600 mg total) by mouth every 8 (eight) hours as needed. 20 tablet 0  . omeprazole (PRILOSEC) 20 MG capsule Take 1 capsule (20 mg total) by mouth daily. 30 capsule 0  . ondansetron (ZOFRAN ODT) 4 MG disintegrating tablet Take 1 tablet (4 mg total) by mouth every 8 (eight) hours as needed for nausea or vomiting. 20 tablet 0  . ARIPiprazole (ABILIFY) 20 MG tablet Take 20 mg by mouth daily.    Marland Kitchen buPROPion (WELLBUTRIN XL) 150 MG 24 hr tablet Take 150 mg by mouth daily.    Marland Kitchen neomycin-polymyxin-hydrocortisone (CORTISPORIN) OTIC solution Place 4 drops into the right ear 4 (four) times daily. 10 mL 0   No facility-administered medications prior to visit.    Allergies  Allergen Reactions  . Tramadol Nausea And Vomiting    ROS Review of Systems  Constitutional: Negative.   HENT: Negative.   Eyes: Negative.   Respiratory: Negative.   Cardiovascular: Negative.   Musculoskeletal: Negative for back pain.  Neurological: Negative.   Psychiatric/Behavioral: Negative.       Objective:    Physical Exam  Constitutional: He is oriented to person, place, and time. He appears well-developed.  HENT:  Head: Normocephalic and atraumatic.  Eyes: Pupils are equal, round, and reactive to light. EOM are normal.  Cardiovascular: Tachycardia present.  Pulmonary/Chest: Effort normal and breath sounds normal.  Neurological: He is alert and oriented to person, place, and time.  Psychiatric: He has a normal mood and affect. His behavior is normal. Judgment and thought content normal.    BP (!) 141/78 (BP Location: Right Arm, Patient Position: Sitting)   Pulse (!) 103   Ht 5\' 11"  (1.803 m)   Wt 158 lb (71.7 kg)   SpO2 96%   BMI 22.04 kg/m  Wt Readings from Last 3 Encounters:  12/13/19 158 lb (71.7 kg)  12/08/19 165 lb (74.8 kg)  07/12/19 165 lb (74.8 kg)     Health Maintenance Due   Topic Date Due  . HIV Screening  Never done  . TETANUS/TDAP  Never done    There are no preventive care reminders to display for this patient.  Lab Results  Component Value Date   TSH 0.709 09/21/2018   Lab Results  Component Value Date   WBC 8.0 12/08/2019   HGB 16.8 12/08/2019   HCT 49.0 12/08/2019   MCV 86.3 12/08/2019   PLT 287 12/08/2019   Lab Results  Component Value Date   NA 131 (L) 12/08/2019   K 3.9 12/08/2019  CO2 24 12/08/2019   GLUCOSE 104 (H) 12/08/2019   BUN 16 12/08/2019   CREATININE 1.10 12/08/2019   BILITOT 2.0 (H) 12/08/2019   ALKPHOS 72 12/08/2019   AST 35 12/08/2019   ALT 22 12/08/2019   PROT 9.0 (H) 12/08/2019   ALBUMIN 5.1 (H) 12/08/2019   CALCIUM 10.4 (H) 12/08/2019   ANIONGAP 16 (H) 12/08/2019   Lab Results  Component Value Date   CHOL 145 09/21/2018   Lab Results  Component Value Date   HDL 38 (L) 09/21/2018   Lab Results  Component Value Date   LDLCALC 62 09/21/2018   Lab Results  Component Value Date   TRIG 226 (H) 09/21/2018   Lab Results  Component Value Date   CHOLHDL 3.8 09/21/2018   Lab Results  Component Value Date   HGBA1C 5.6 09/21/2018      Assessment & Plan:    1. Gastroesophageal reflux disease without esophagitis - His acid reflux is under control and he will continue on current treatment regimen. - omeprazole (PRILOSEC) 20 MG capsule; Take 1 capsule (20 mg total) by mouth daily.  Dispense: 30 capsule; Refill: 3  2. Essential hypertension - His blood pressure is elevated, he will start on 2.5 mg Amlodipine, was educated on medication side effects and was advised to notify clinic, continue on DASH diet and exercise as tolerated. Will recheck EKG if Tachycardia persists. He was advised on smoking cessation and alcohol abstinence. - amLODipine (NORVASC) 2.5 MG tablet; Take 1 tablet (2.5 mg total) by mouth daily.  Dispense: 30 tablet; Refill: 0  3. Paresthesia of both hands - He will continue on current  medication. - gabapentin (NEURONTIN) 400 MG capsule; Take 1 capsule (400 mg total) by mouth at bedtime.  Dispense: 30 capsule; Refill: 1      Follow-up: Return in about 1 month (around 01/15/2020), or if symptoms worsen or fail to improve.    Shontez Sermon Jerold Coombe, NP

## 2020-01-10 ENCOUNTER — Telehealth: Payer: Self-pay | Admitting: Pharmacy Technician

## 2020-01-10 NOTE — Telephone Encounter (Signed)
Patient failed to provide 2020 Federal Tax Return.  No additional medication assistance will be provided until requested financial documentation is provided.  Sherilyn Dacosta Care Manager [facility]

## 2020-01-15 ENCOUNTER — Ambulatory Visit: Payer: Self-pay | Admitting: Gerontology

## 2020-01-16 ENCOUNTER — Ambulatory Visit: Payer: Self-pay | Admitting: Gerontology

## 2020-01-17 ENCOUNTER — Other Ambulatory Visit: Payer: Self-pay | Admitting: Gerontology

## 2020-01-17 DIAGNOSIS — I10 Essential (primary) hypertension: Secondary | ICD-10-CM

## 2020-01-22 ENCOUNTER — Ambulatory Visit: Payer: Self-pay | Admitting: Gerontology

## 2020-01-30 ENCOUNTER — Encounter: Payer: Self-pay | Admitting: Gerontology

## 2020-01-30 ENCOUNTER — Other Ambulatory Visit: Payer: Self-pay

## 2020-01-30 ENCOUNTER — Ambulatory Visit: Payer: Self-pay | Admitting: Gerontology

## 2020-01-30 VITALS — BP 131/87 | HR 96 | Ht 71.0 in | Wt 146.4 lb

## 2020-01-30 DIAGNOSIS — R202 Paresthesia of skin: Secondary | ICD-10-CM

## 2020-01-30 DIAGNOSIS — G4700 Insomnia, unspecified: Secondary | ICD-10-CM | POA: Insufficient documentation

## 2020-01-30 DIAGNOSIS — K219 Gastro-esophageal reflux disease without esophagitis: Secondary | ICD-10-CM

## 2020-01-30 DIAGNOSIS — I10 Essential (primary) hypertension: Secondary | ICD-10-CM

## 2020-01-30 MED ORDER — AMLODIPINE BESYLATE 2.5 MG PO TABS: 2.5000 mg | ORAL_TABLET | Freq: Every day | ORAL | 2 refills | Status: DC

## 2020-01-30 MED ORDER — GABAPENTIN 400 MG PO CAPS: 400.0000 mg | ORAL_CAPSULE | Freq: Every day | ORAL | 2 refills | Status: DC
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
## 2020-01-30 NOTE — Patient Instructions (Signed)
DASH Eating Plan DASH stands for "Dietary Approaches to Stop Hypertension." The DASH eating plan is a healthy eating plan that has been shown to reduce high blood pressure (hypertension). It may also reduce your risk for type 2 diabetes, heart disease, and stroke. The DASH eating plan may also help with weight loss. What are tips for following this plan?  General guidelines  Avoid eating more than 2,300 mg (milligrams) of salt (sodium) a day. If you have hypertension, you may need to reduce your sodium intake to 1,500 mg a day.  Limit alcohol intake to no more than 1 drink a day for nonpregnant women and 2 drinks a day for men. One drink equals 12 oz of beer, 5 oz of wine, or 1 oz of hard liquor.  Work with your health care provider to maintain a healthy body weight or to lose weight. Ask what an ideal weight is for you.  Get at least 30 minutes of exercise that causes your heart to beat faster (aerobic exercise) most days of the week. Activities may include walking, swimming, or biking.  Work with your health care provider or diet and nutrition specialist (dietitian) to adjust your eating plan to your individual calorie needs. Reading food labels   Check food labels for the amount of sodium per serving. Choose foods with less than 5 percent of the Daily Value of sodium. Generally, foods with less than 300 mg of sodium per serving fit into this eating plan.  To find whole grains, look for the word "whole" as the first word in the ingredient list. Shopping  Buy products labeled as "low-sodium" or "no salt added."  Buy fresh foods. Avoid canned foods and premade or frozen meals. Cooking  Avoid adding salt when cooking. Use salt-free seasonings or herbs instead of table salt or sea salt. Check with your health care provider or pharmacist before using salt substitutes.  Do not fry foods. Cook foods using healthy methods such as baking, boiling, grilling, and broiling instead.  Cook with  heart-healthy oils, such as olive, canola, soybean, or sunflower oil. Meal planning  Eat a balanced diet that includes: ? 5 or more servings of fruits and vegetables each day. At each meal, try to fill half of your plate with fruits and vegetables. ? Up to 6-8 servings of whole grains each day. ? Less than 6 oz of lean meat, poultry, or fish each day. A 3-oz serving of meat is about the same size as a deck of cards. One egg equals 1 oz. ? 2 servings of low-fat dairy each day. ? A serving of nuts, seeds, or beans 5 times each week. ? Heart-healthy fats. Healthy fats called Omega-3 fatty acids are found in foods such as flaxseeds and coldwater fish, like sardines, salmon, and mackerel.  Limit how much you eat of the following: ? Canned or prepackaged foods. ? Food that is high in trans fat, such as fried foods. ? Food that is high in saturated fat, such as fatty meat. ? Sweets, desserts, sugary drinks, and other foods with added sugar. ? Full-fat dairy products.  Do not salt foods before eating.  Try to eat at least 2 vegetarian meals each week.  Eat more home-cooked food and less restaurant, buffet, and fast food.  When eating at a restaurant, ask that your food be prepared with less salt or no salt, if possible. What foods are recommended? The items listed may not be a complete list. Talk with your dietitian about   what dietary choices are best for you. Grains Whole-grain or whole-wheat bread. Whole-grain or whole-wheat pasta. Brown rice. Oatmeal. Quinoa. Bulgur. Whole-grain and low-sodium cereals. Pita bread. Low-fat, low-sodium crackers. Whole-wheat flour tortillas. Vegetables Fresh or frozen vegetables (raw, steamed, roasted, or grilled). Low-sodium or reduced-sodium tomato and vegetable juice. Low-sodium or reduced-sodium tomato sauce and tomato paste. Low-sodium or reduced-sodium canned vegetables. Fruits All fresh, dried, or frozen fruit. Canned fruit in natural juice (without  added sugar). Meat and other protein foods Skinless chicken or turkey. Ground chicken or turkey. Pork with fat trimmed off. Fish and seafood. Egg whites. Dried beans, peas, or lentils. Unsalted nuts, nut butters, and seeds. Unsalted canned beans. Lean cuts of beef with fat trimmed off. Low-sodium, lean deli meat. Dairy Low-fat (1%) or fat-free (skim) milk. Fat-free, low-fat, or reduced-fat cheeses. Nonfat, low-sodium ricotta or cottage cheese. Low-fat or nonfat yogurt. Low-fat, low-sodium cheese. Fats and oils Soft margarine without trans fats. Vegetable oil. Low-fat, reduced-fat, or light mayonnaise and salad dressings (reduced-sodium). Canola, safflower, olive, soybean, and sunflower oils. Avocado. Seasoning and other foods Herbs. Spices. Seasoning mixes without salt. Unsalted popcorn and pretzels. Fat-free sweets. What foods are not recommended? The items listed may not be a complete list. Talk with your dietitian about what dietary choices are best for you. Grains Baked goods made with fat, such as croissants, muffins, or some breads. Dry pasta or rice meal packs. Vegetables Creamed or fried vegetables. Vegetables in a cheese sauce. Regular canned vegetables (not low-sodium or reduced-sodium). Regular canned tomato sauce and paste (not low-sodium or reduced-sodium). Regular tomato and vegetable juice (not low-sodium or reduced-sodium). Pickles. Olives. Fruits Canned fruit in a light or heavy syrup. Fried fruit. Fruit in cream or butter sauce. Meat and other protein foods Fatty cuts of meat. Ribs. Fried meat. Bacon. Sausage. Bologna and other processed lunch meats. Salami. Fatback. Hotdogs. Bratwurst. Salted nuts and seeds. Canned beans with added salt. Canned or smoked fish. Whole eggs or egg yolks. Chicken or turkey with skin. Dairy Whole or 2% milk, cream, and half-and-half. Whole or full-fat cream cheese. Whole-fat or sweetened yogurt. Full-fat cheese. Nondairy creamers. Whipped toppings.  Processed cheese and cheese spreads. Fats and oils Butter. Stick margarine. Lard. Shortening. Ghee. Bacon fat. Tropical oils, such as coconut, palm kernel, or palm oil. Seasoning and other foods Salted popcorn and pretzels. Onion salt, garlic salt, seasoned salt, table salt, and sea salt. Worcestershire sauce. Tartar sauce. Barbecue sauce. Teriyaki sauce. Soy sauce, including reduced-sodium. Steak sauce. Canned and packaged gravies. Fish sauce. Oyster sauce. Cocktail sauce. Horseradish that you find on the shelf. Ketchup. Mustard. Meat flavorings and tenderizers. Bouillon cubes. Hot sauce and Tabasco sauce. Premade or packaged marinades. Premade or packaged taco seasonings. Relishes. Regular salad dressings. Where to find more information:  National Heart, Lung, and Blood Institute: www.nhlbi.nih.gov  American Heart Association: www.heart.org Summary  The DASH eating plan is a healthy eating plan that has been shown to reduce high blood pressure (hypertension). It may also reduce your risk for type 2 diabetes, heart disease, and stroke.  With the DASH eating plan, you should limit salt (sodium) intake to 2,300 mg a day. If you have hypertension, you may need to reduce your sodium intake to 1,500 mg a day.  When on the DASH eating plan, aim to eat more fresh fruits and vegetables, whole grains, lean proteins, low-fat dairy, and heart-healthy fats.  Work with your health care provider or diet and nutrition specialist (dietitian) to adjust your eating plan to your   individual calorie needs. This information is not intended to replace advice given to you by your health care provider. Make sure you discuss any questions you have with your health care provider. Document Revised: 06/10/2017 Document Reviewed: 06/21/2016 Elsevier Patient Education  2020 [employer]  

## 2020-02-15 ENCOUNTER — Encounter (INDEPENDENT_AMBULATORY_CARE_PROVIDER_SITE_OTHER): Payer: Self-pay

## 2020-02-18 ENCOUNTER — Telehealth: Payer: Self-pay | Admitting: Pharmacy Technician

## 2020-02-18 NOTE — Telephone Encounter (Signed)
Received 2020 Federal Tax Return.  Andrew Long Care Manager [facility]

## 2020-02-19 ENCOUNTER — Institutional Professional Consult (permissible substitution): Payer: Self-pay | Admitting: Licensed Clinical Social Worker

## 2020-04-30 ENCOUNTER — Ambulatory Visit: Payer: Self-pay | Admitting: Gerontology

## 2020-04-30 ENCOUNTER — Other Ambulatory Visit: Payer: Self-pay

## 2020-04-30 ENCOUNTER — Other Ambulatory Visit: Payer: Self-pay | Admitting: Gerontology

## 2020-04-30 VITALS — BP 122/78 | HR 97 | Temp 98.6°F | Resp 16 | Wt 144.6 lb

## 2020-04-30 DIAGNOSIS — Z0000 Encounter for general adult medical examination without abnormal findings: Secondary | ICD-10-CM

## 2020-04-30 DIAGNOSIS — I10 Essential (primary) hypertension: Secondary | ICD-10-CM

## 2020-04-30 DIAGNOSIS — R202 Paresthesia of skin: Secondary | ICD-10-CM

## 2020-04-30 DIAGNOSIS — K219 Gastro-esophageal reflux disease without esophagitis: Secondary | ICD-10-CM

## 2020-04-30 DIAGNOSIS — G4700 Insomnia, unspecified: Secondary | ICD-10-CM

## 2020-04-30 MED ORDER — OMEPRAZOLE 20 MG PO CPDR: 20.0000 mg | DELAYED_RELEASE_CAPSULE | Freq: Every day | ORAL | 4 refills | Status: DC

## 2020-04-30 MED ORDER — AMLODIPINE BESYLATE 2.5 MG PO TABS: 2.5000 mg | ORAL_TABLET | Freq: Every day | ORAL | 3 refills | Status: DC

## 2020-04-30 MED ORDER — GABAPENTIN 400 MG PO CAPS: 400.0000 mg | ORAL_CAPSULE | Freq: Every day | ORAL | 3 refills | Status: DC

## 2020-04-30 NOTE — Patient Instructions (Signed)
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
## 2020-08-01 ENCOUNTER — Other Ambulatory Visit: Payer: Self-pay | Admitting: Gerontology

## 2020-08-01 DIAGNOSIS — K219 Gastro-esophageal reflux disease without esophagitis: Secondary | ICD-10-CM

## 2020-08-11 ENCOUNTER — Other Ambulatory Visit: Payer: Self-pay | Admitting: Gerontology

## 2020-08-11 DIAGNOSIS — I10 Essential (primary) hypertension: Secondary | ICD-10-CM

## 2020-08-12 ENCOUNTER — Other Ambulatory Visit: Payer: Self-pay | Admitting: Gerontology

## 2020-08-26 ENCOUNTER — Other Ambulatory Visit: Payer: Self-pay

## 2020-08-26 ENCOUNTER — Encounter: Payer: Self-pay | Admitting: Gerontology

## 2020-08-26 ENCOUNTER — Ambulatory Visit: Payer: Self-pay | Admitting: Gerontology

## 2020-08-26 ENCOUNTER — Other Ambulatory Visit: Payer: Self-pay | Admitting: Gerontology

## 2020-08-26 VITALS — BP 116/78 | HR 83 | Temp 96.8°F | Resp 16 | Wt 161.7 lb

## 2020-08-26 DIAGNOSIS — K219 Gastro-esophageal reflux disease without esophagitis: Secondary | ICD-10-CM

## 2020-08-26 DIAGNOSIS — R202 Paresthesia of skin: Secondary | ICD-10-CM

## 2020-08-26 DIAGNOSIS — I10 Essential (primary) hypertension: Secondary | ICD-10-CM

## 2020-08-26 MED ORDER — GABAPENTIN 300 MG PO CAPS: 600.0000 mg | ORAL_CAPSULE | Freq: Every day | ORAL | 0 refills | Status: DC

## 2020-08-26 MED ORDER — OMEPRAZOLE 20 MG PO CPDR: 20.0000 mg | DELAYED_RELEASE_CAPSULE | Freq: Every day | ORAL | 3 refills | Status: DC
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
## 2020-08-26 NOTE — Patient Instructions (Signed)

## 2020-08-27 ENCOUNTER — Ambulatory Visit: Payer: Self-pay | Admitting: Gerontology

## 2020-08-28 ENCOUNTER — Ambulatory Visit: Payer: Self-pay | Admitting: Gerontology

## 2020-09-12 ENCOUNTER — Other Ambulatory Visit: Payer: Self-pay | Admitting: Student

## 2020-09-23 ENCOUNTER — Other Ambulatory Visit: Payer: Self-pay

## 2020-09-23 ENCOUNTER — Other Ambulatory Visit: Payer: Self-pay | Admitting: Gerontology

## 2020-09-23 ENCOUNTER — Ambulatory Visit: Payer: Self-pay | Admitting: Gerontology

## 2020-09-23 VITALS — BP 109/67 | HR 85 | Temp 97.7°F | Resp 16 | Wt 156.6 lb

## 2020-09-23 DIAGNOSIS — R202 Paresthesia of skin: Secondary | ICD-10-CM

## 2020-09-23 MED ORDER — GABAPENTIN 400 MG PO CAPS: 800.0000 mg | ORAL_CAPSULE | Freq: Two times a day (BID) | ORAL | 2 refills | Status: DC

## 2020-09-23 MED ORDER — GABAPENTIN 400 MG PO CAPS: 400.0000 mg | ORAL_CAPSULE | Freq: Two times a day (BID) | ORAL | 2 refills | Status: DC
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
## 2020-10-13 ENCOUNTER — Other Ambulatory Visit: Payer: Self-pay

## 2020-10-13 MED ORDER — NALTREXONE HCL 50 MG PO TABS
ORAL_TABLET | ORAL | 0 refills | Status: DC
  Filled 2020-10-13: qty 30, 30d supply, fill #0

## 2020-10-13 MED ORDER — NALTREXONE HCL 50 MG PO TABS
ORAL_TABLET | ORAL | 0 refills | Status: DC
Start: 2020-10-10 — End: 2020-10-13
  Filled 2020-10-13: qty 30, 30d supply, fill #0

## 2020-10-14 ENCOUNTER — Other Ambulatory Visit: Payer: Self-pay

## 2020-10-23 ENCOUNTER — Other Ambulatory Visit: Payer: Self-pay

## 2020-10-23 MED FILL — Gabapentin Cap 400 MG: ORAL | 30 days supply | Qty: 60 | Fill #0 | Status: AC

## 2020-10-23 MED FILL — Omeprazole Cap Delayed Release 20 MG: ORAL | Qty: 30 | Fill #0 | Status: CN

## 2020-11-14 ENCOUNTER — Other Ambulatory Visit: Payer: Self-pay

## 2020-11-14 MED ORDER — OXCARBAZEPINE 300 MG PO TABS
ORAL_TABLET | ORAL | 0 refills | Status: DC
  Filled 2020-11-14: qty 60, 30d supply, fill #0

## 2020-11-14 MED ORDER — NALTREXONE HCL 50 MG PO TABS
ORAL_TABLET | ORAL | 0 refills | Status: DC
  Filled 2020-11-14: qty 30, 30d supply, fill #0

## 2020-11-14 MED FILL — Amlodipine Besylate Tab 2.5 MG (Base Equivalent): ORAL | 90 days supply | Qty: 90 | Fill #0 | Status: AC

## 2020-11-14 MED FILL — Omeprazole Cap Delayed Release 20 MG: ORAL | 30 days supply | Qty: 30 | Fill #0 | Status: AC

## 2020-12-24 ENCOUNTER — Ambulatory Visit: Payer: Self-pay | Admitting: Gerontology

## 2020-12-24 ENCOUNTER — Other Ambulatory Visit: Payer: Self-pay

## 2020-12-24 ENCOUNTER — Encounter: Payer: Self-pay | Admitting: Gerontology

## 2020-12-24 VITALS — BP 123/77 | HR 71 | Temp 98.6°F | Resp 16 | Ht 71.0 in | Wt 142.6 lb

## 2020-12-24 DIAGNOSIS — I10 Essential (primary) hypertension: Secondary | ICD-10-CM

## 2020-12-24 DIAGNOSIS — K219 Gastro-esophageal reflux disease without esophagitis: Secondary | ICD-10-CM

## 2020-12-24 DIAGNOSIS — R202 Paresthesia of skin: Secondary | ICD-10-CM

## 2020-12-24 DIAGNOSIS — Z0000 Encounter for general adult medical examination without abnormal findings: Secondary | ICD-10-CM

## 2020-12-24 MED ORDER — AMLODIPINE BESYLATE 2.5 MG PO TABS
ORAL_TABLET | Freq: Every day | ORAL | 1 refills | Status: DC
  Filled 2020-12-24 (×2): qty 90, fill #0

## 2020-12-24 MED ORDER — GABAPENTIN 400 MG PO CAPS
ORAL_CAPSULE | Freq: Two times a day (BID) | ORAL | 2 refills | Status: DC
  Filled 2020-12-24: qty 60, 30d supply, fill #0

## 2020-12-24 MED ORDER — OMEPRAZOLE 20 MG PO CPDR
DELAYED_RELEASE_CAPSULE | Freq: Every day | ORAL | 3 refills | Status: DC
  Filled 2020-12-24: qty 30, 30d supply, fill #0
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
## 2020-12-24 NOTE — Patient Instructions (Signed)
https://www.nhlbi.nih.gov/files/docs/public/heart/dash_brief.pdf">  DASH Eating Plan DASH stands for Dietary Approaches to Stop Hypertension. The DASH eating plan is a healthy eating plan that has been shown to: Reduce high blood pressure (hypertension). Reduce your risk for type 2 diabetes, heart disease, and stroke. Help with weight loss. What are tips for following this plan? Reading food labels Check food labels for the amount of salt (sodium) per serving. Choose foods with less than 5 percent of the Daily Value of sodium. Generally, foods with less than 300 milligrams (mg) of sodium per serving fit into this eating plan. To find whole grains, look for the word "whole" as the first word in the ingredient list. Shopping Buy products labeled as "low-sodium" or "no salt added." Buy fresh foods. Avoid canned foods and pre-made or frozen meals. Cooking Avoid adding salt when cooking. Use salt-free seasonings or herbs instead of table salt or sea salt. Check with your health care provider or pharmacist before using salt substitutes. Do not fry foods. Cook foods using healthy methods such as baking, boiling, grilling, roasting, and broiling instead. Cook with heart-healthy oils, such as olive, canola, avocado, soybean, or sunflower oil. Meal planning  Eat a balanced diet that includes: 4 or more servings of fruits and 4 or more servings of vegetables each day. Try to fill one-half of your plate with fruits and vegetables. 6-8 servings of whole grains each day. Less than 6 oz (170 g) of lean meat, poultry, or fish each day. A 3-oz (85-g) serving of meat is about the same size as a deck of cards. One egg equals 1 oz (28 g). 2-3 servings of low-fat dairy each day. One serving is 1 cup (237 mL). 1 serving of nuts, seeds, or beans 5 times each week. 2-3 servings of heart-healthy fats. Healthy fats called omega-3 fatty acids are found in foods such as walnuts, flaxseeds, fortified milks, and eggs.  These fats are also found in cold-water fish, such as sardines, salmon, and mackerel. Limit how much you eat of: Canned or prepackaged foods. Food that is high in trans fat, such as some fried foods. Food that is high in saturated fat, such as fatty meat. Desserts and other sweets, sugary drinks, and other foods with added sugar. Full-fat dairy products. Do not salt foods before eating. Do not eat more than 4 egg yolks a week. Try to eat at least 2 vegetarian meals a week. Eat more home-cooked food and less restaurant, buffet, and fast food.  Lifestyle When eating at a restaurant, ask that your food be prepared with less salt or no salt, if possible. If you drink alcohol: Limit how much you use to: 0-1 drink a day for women who are not pregnant. 0-2 drinks a day for men. Be aware of how much alcohol is in your drink. In the [place] one drink equals one 12 oz bottle of beer (355 mL), one 5 oz glass of wine (148 mL), or one 1 oz glass of hard liquor (44 mL). General information Avoid eating more than 2,300 mg of salt a day. If you have hypertension, you may need to reduce your sodium intake to 1,500 mg a day. Work with your health care provider to maintain a healthy body weight or to lose weight. Ask what an ideal weight is for you. Get at least 30 minutes of exercise that causes your heart to beat faster (aerobic exercise) most days of the week. Activities may include walking, swimming, or biking. Work with your health care provider   or dietitian to adjust your eating plan to your individual calorie needs. What foods should I eat? Fruits All fresh, dried, or frozen fruit. Canned fruit in natural juice (without addedsugar). Vegetables Fresh or frozen vegetables (raw, steamed, roasted, or grilled). Low-sodium or reduced-sodium tomato and vegetable juice. Low-sodium or reduced-sodium tomatosauce and tomato paste. Low-sodium or reduced-sodium canned vegetables. Grains Whole-grain or  whole-wheat bread. Whole-grain or whole-wheat pasta. Brown rice. Oatmeal. Quinoa. Bulgur. Whole-grain and low-sodium cereals. Pita bread.Low-fat, low-sodium crackers. Whole-wheat flour tortillas. Meats and other proteins Skinless chicken or turkey. Ground chicken or turkey. Pork with fat trimmed off. Fish and seafood. Egg whites. Dried beans, peas, or lentils. Unsalted nuts, nut butters, and seeds. Unsalted canned beans. Lean cuts of beef with fat trimmed off. Low-sodium, lean precooked or cured meat, such as sausages or meatloaves. Dairy Low-fat (1%) or fat-free (skim) milk. Reduced-fat, low-fat, or fat-free cheeses. Nonfat, low-sodium ricotta or cottage cheese. Low-fat or nonfatyogurt. Low-fat, low-sodium cheese. Fats and oils Soft margarine without trans fats. Vegetable oil. Reduced-fat, low-fat, or light mayonnaise and salad dressings (reduced-sodium). Canola, safflower, olive, avocado, soybean, andsunflower oils. Avocado. Seasonings and condiments Herbs. Spices. Seasoning mixes without salt. Other foods Unsalted popcorn and pretzels. Fat-free sweets. The items listed above may not be a complete list of foods and beverages you can eat. Contact a dietitian for more information. What foods should I avoid? Fruits Canned fruit in a light or heavy syrup. Fried fruit. Fruit in cream or buttersauce. Vegetables Creamed or fried vegetables. Vegetables in a cheese sauce. Regular canned vegetables (not low-sodium or reduced-sodium). Regular canned tomato sauce and paste (not low-sodium or reduced-sodium). Regular tomato and vegetable juice(not low-sodium or reduced-sodium). Pickles. Olives. Grains Baked goods made with fat, such as croissants, muffins, or some breads. Drypasta or rice meal packs. Meats and other proteins Fatty cuts of meat. Ribs. Fried meat. Bacon. Bologna, salami, and other precooked or cured meats, such as sausages or meat loaves. Fat from the back of a pig (fatback). Bratwurst.  Salted nuts and seeds. Canned beans with added salt. Canned orsmoked fish. Whole eggs or egg yolks. Chicken or turkey with skin. Dairy Whole or 2% milk, cream, and half-and-half. Whole or full-fat cream cheese. Whole-fat or sweetened yogurt. Full-fat cheese. Nondairy creamers. Whippedtoppings. Processed cheese and cheese spreads. Fats and oils Butter. Stick margarine. Lard. Shortening. Ghee. Bacon fat. Tropical oils, suchas coconut, palm kernel, or palm oil. Seasonings and condiments Onion salt, garlic salt, seasoned salt, table salt, and sea salt. Worcestershire sauce. Tartar sauce. Barbecue sauce. Teriyaki sauce. Soy sauce, including reduced-sodium. Steak sauce. Canned and packaged gravies. Fish sauce. Oyster sauce. Cocktail sauce. Store-bought horseradish. Ketchup. Mustard. Meat flavorings and tenderizers. Bouillon cubes. Hot sauces. Pre-made or packaged marinades. Pre-made or packaged taco seasonings. Relishes. Regular saladdressings. Other foods Salted popcorn and pretzels. The items listed above may not be a complete list of foods and beverages you should avoid. Contact a dietitian for more information. Where to find more information National Heart, Lung, and Blood Institute: www.nhlbi.nih.gov American Heart Association: www.heart.org Academy of Nutrition and Dietetics: www.eatright.org National Kidney Foundation: www.kidney.org Summary The DASH eating plan is a healthy eating plan that has been shown to reduce high blood pressure (hypertension). It may also reduce your risk for type 2 diabetes, heart disease, and stroke. When on the DASH eating plan, aim to eat more fresh fruits and vegetables, whole grains, lean proteins, low-fat dairy, and heart-healthy fats. With the DASH eating plan, you should limit salt (sodium) intake to 2,300   mg a day. If you have hypertension, you may need to reduce your sodium intake to 1,500 mg a day. Work with your health care provider or dietitian to adjust  your eating plan to your individual calorie needs. This information is not intended to replace advice given to you by your health care provider. Make sure you discuss any questions you have with your healthcare provider. Document Revised: 06/01/2019 Document Reviewed: 06/01/2019 Elsevier Patient Education  2022 Elsevier Inc.  

## 2020-12-25 LAB — CBC WITH DIFFERENTIAL/PLATELET
Basophils Absolute: 0.1 10*3/uL (ref 0.0–0.2)
Basos: 1 %
EOS (ABSOLUTE): 0.2 10*3/uL (ref 0.0–0.4)
Eos: 3 %
Hematocrit: 41.6 % (ref 37.5–51.0)
Hemoglobin: 14 g/dL (ref 13.0–17.7)
Immature Grans (Abs): 0 10*3/uL (ref 0.0–0.1)
Immature Granulocytes: 0 %
Lymphocytes Absolute: 1.9 10*3/uL (ref 0.7–3.1)
Lymphs: 35 %
MCH: 29 pg (ref 26.6–33.0)
MCHC: 33.7 g/dL (ref 31.5–35.7)
MCV: 86 fL (ref 79–97)
Monocytes Absolute: 0.8 10*3/uL (ref 0.1–0.9)
Monocytes: 14 %
Neutrophils Absolute: 2.5 10*3/uL (ref 1.4–7.0)
Neutrophils: 47 %
Platelets: 285 10*3/uL (ref 150–450)
RBC: 4.83 x10E6/uL (ref 4.14–5.80)
RDW: 13.9 % (ref 11.6–15.4)
WBC: 5.4 10*3/uL (ref 3.4–10.8)

## 2020-12-25 LAB — LIPID PANEL
Chol/HDL Ratio: 2.7 ratio (ref 0.0–5.0)
Cholesterol, Total: 126 mg/dL (ref 100–199)
HDL: 47 mg/dL (ref >39)
LDL Chol Calc (NIH): 61 mg/dL (ref 0–99)
Triglycerides: 94 mg/dL (ref 0–149)
VLDL Cholesterol Cal: 18 mg/dL (ref 5–40)

## 2020-12-25 LAB — HEMOGLOBIN A1C
Est. average glucose Bld gHb Est-mCnc: 105 mg/dL
Hgb A1c MFr Bld: 5.3 % (ref 4.8–5.6)

## 2020-12-25 LAB — COMPREHENSIVE METABOLIC PANEL
ALT: 15 IU/L (ref 0–44)
AST: 20 IU/L (ref 0–40)
Albumin/Globulin Ratio: 2 (ref 1.2–2.2)
Albumin: 4.4 g/dL (ref 4.0–5.0)
Alkaline Phosphatase: 64 IU/L (ref 44–121)
BUN/Creatinine Ratio: 9 (ref 9–20)
BUN: 7 mg/dL (ref 6–20)
Bilirubin Total: 0.5 mg/dL (ref 0.0–1.2)
CO2: 22 mmol/L (ref 20–29)
Calcium: 9.7 mg/dL (ref 8.7–10.2)
Chloride: 104 mmol/L (ref 96–106)
Creatinine, Ser: 0.8 mg/dL (ref 0.76–1.27)
Globulin, Total: 2.2 g/dL (ref 1.5–4.5)
Glucose: 71 mg/dL (ref 65–99)
Potassium: 4.4 mmol/L (ref 3.5–5.2)
Sodium: 144 mmol/L (ref 134–144)
Total Protein: 6.6 g/dL (ref 6.0–8.5)
eGFR: 117 mL/min/{1.73_m2} (ref >59)

## 2021-01-02 ENCOUNTER — Other Ambulatory Visit: Payer: Self-pay

## 2021-01-02 MED ORDER — NALTREXONE HCL 50 MG PO TABS
ORAL_TABLET | ORAL | 0 refills | Status: DC
  Filled 2021-01-02: qty 30, 30d supply, fill #0

## 2021-01-06 ENCOUNTER — Telehealth: Payer: Self-pay | Admitting: Pharmacy Technician

## 2021-01-06 NOTE — Telephone Encounter (Signed)
Patient failed to provide 2022 proof of income.  No additional medication assistance will be provided by [facility] without the required proof of income documentation.  Patient notified by letter.  Sherilyn Dacosta Care Manager [facility]    Andrew Long [address], [place]  [address]  January 06, 2021    Andrew Long [address] [place], [place]  [address]  Dear [name]:  This is to inform you that you are no longer eligible to receive medication assistance at [facility].  The reason(s) are:    _____Your total gross monthly household income exceeds 250% of the Federal Poverty Level.   _____Tangible assets (savings, checking, stocks/bonds, pension, retirement, etc.) exceeds our limit  _____You are eligible to receive benefits from [facility], [facility] or HIV Medication              Assistance Program _____You are eligible to receive benefits from a Medicare Part "D" plan _____You have prescription insurance  _____You are not an [facility] resident __X__Failure to provide all requested documentation.  Still need last 30 days of paystubs, 2021 Federal Tax Return and current bill showing your name and address.  Medication assistance will resume once all requested documentation has been returned to our clinic.  If you have questions, please contact our clinic at [phone].    Thank you,  [facility]

## 2021-01-07 ENCOUNTER — Other Ambulatory Visit: Payer: Self-pay

## 2021-01-29 ENCOUNTER — Other Ambulatory Visit: Payer: Self-pay

## 2021-02-15 ENCOUNTER — Other Ambulatory Visit: Payer: Self-pay

## 2021-02-15 ENCOUNTER — Emergency Department
Admission: EM | Admit: 2021-02-15 | Discharge: 2021-02-15 | Disposition: A | Discharge status: Home / Self Care | Payer: Self-pay | Attending: Emergency Medicine | Admitting: Emergency Medicine

## 2021-02-15 ENCOUNTER — Emergency Department: Payer: Self-pay

## 2021-02-15 DIAGNOSIS — Z79899 Other long term (current) drug therapy: Secondary | ICD-10-CM | POA: Insufficient documentation

## 2021-02-15 DIAGNOSIS — I10 Essential (primary) hypertension: Secondary | ICD-10-CM | POA: Insufficient documentation

## 2021-02-15 DIAGNOSIS — Z9189 Other specified personal risk factors, not elsewhere classified: Secondary | ICD-10-CM

## 2021-02-15 DIAGNOSIS — Z20822 Contact with and (suspected) exposure to covid-19: Secondary | ICD-10-CM | POA: Insufficient documentation

## 2021-02-15 DIAGNOSIS — F17210 Nicotine dependence, cigarettes, uncomplicated: Secondary | ICD-10-CM | POA: Insufficient documentation

## 2021-02-15 DIAGNOSIS — R079 Chest pain, unspecified: Secondary | ICD-10-CM

## 2021-02-15 DIAGNOSIS — R0789 Other chest pain: Secondary | ICD-10-CM | POA: Insufficient documentation

## 2021-02-15 LAB — TROPONIN I (HIGH SENSITIVITY)
Troponin I (High Sensitivity): 3 ng/L (ref <18)
Troponin I (High Sensitivity): 3 ng/L (ref <18)

## 2021-02-15 LAB — BASIC METABOLIC PANEL
Anion gap: 9 (ref 5–15)
BUN: 11 mg/dL (ref 6–20)
CO2: 28 mmol/L (ref 22–32)
Calcium: 9.6 mg/dL (ref 8.9–10.3)
Chloride: 101 mmol/L (ref 98–111)
Creatinine, Ser: 0.86 mg/dL (ref 0.61–1.24)
GFR, Estimated: >60 mL/min (ref >60)
Glucose, Bld: 107 mg/dL — ABNORMAL HIGH (ref 70–99)
Potassium: 3.8 mmol/L (ref 3.5–5.1)
Sodium: 138 mmol/L (ref 135–145)

## 2021-02-15 LAB — CBC
HCT: 43.9 % (ref 39.0–52.0)
Hemoglobin: 15.2 g/dL (ref 13.0–17.0)
MCH: 30.4 pg (ref 26.0–34.0)
MCHC: 34.6 g/dL (ref 30.0–36.0)
MCV: 87.8 fL (ref 80.0–100.0)
Platelets: 194 10*3/uL (ref 150–400)
RBC: 5 MIL/uL (ref 4.22–5.81)
RDW: 15.1 % (ref 11.5–15.5)
WBC: 8 10*3/uL (ref 4.0–10.5)
nRBC: 0 % (ref 0.0–0.2)

## 2021-02-15 LAB — RESP PANEL BY RT-PCR (FLU A&B, COVID) ARPGX2
Influenza A by PCR: NEGATIVE
Influenza B by PCR: NEGATIVE
SARS Coronavirus 2 by RT PCR: NEGATIVE

## 2021-02-15 LAB — D-DIMER, QUANTITATIVE: D-Dimer, Quant: <0.27 ug/mL-FEU (ref 0.00–0.50)

## 2021-02-15 MED ORDER — HYDROCODONE-ACETAMINOPHEN 5-325 MG PO TABS
1.0000 | ORAL_TABLET | Freq: Once | ORAL | Status: AC
  Administered 2021-02-15: 1 via ORAL
  Filled 2021-02-15: qty 1

## 2021-02-15 MED ORDER — MELOXICAM 7.5 MG PO TABS
15.0000 mg | ORAL_TABLET | Freq: Once | ORAL | Status: AC
  Administered 2021-02-15: 15 mg via ORAL
  Filled 2021-02-15: qty 2

## 2021-02-15 MED ORDER — IPRATROPIUM-ALBUTEROL 0.5-2.5 (3) MG/3ML IN SOLN
3.0000 mL | Freq: Once | RESPIRATORY_TRACT | Status: AC
  Administered 2021-02-15: 3 mL via RESPIRATORY_TRACT
  Filled 2021-02-15: qty 3

## 2021-02-15 MED ORDER — MELOXICAM 15 MG PO TABS: 15.0000 mg | ORAL_TABLET | Freq: Every day | ORAL | 0 refills | Status: DC

## 2021-02-15 NOTE — ED Provider Notes (Signed)
EKG is reviewed inter by me at 1130 Heart rate 109 QRS 89 QTc 410 Sinus tachycardia, LVH with repolarization abnormality.  Compared with previous EKG from Dec 08, 2019, again noted sinus tachycardia, LVH with repolarization abnormality.   Sharyn Creamer, MD 02/15/21 1556

## 2021-02-15 NOTE — ED Triage Notes (Signed)
Pt reports woke up this am with sharp CP to his mid chest and some SOB.

## 2021-02-15 NOTE — ED Provider Notes (Signed)
[facility] Emergency Department Provider Note  ____________________________________________   Event Date/Time   First MD Initiated Contact with Patient 02/15/21 1441     (approximate)  I have reviewed the triage vital signs and the nursing notes.   HISTORY  Chief Complaint Chest Pain and Shortness of Breath   HPI Andrew Long is a 40 y.o. male with a history of hypertension presents to the emergency department for treatment and evaluation of central chest pain that awakened him from sleep earlier today. He also feels short of breath.  Chest pain increases with movement and deep inspiration.  No alleviating measures attempted prior to arrival.  No similar symptoms in the past.   Past Medical History:  Diagnosis Date   Acid reflux 09/06/2019   Allergy    Depression    Essential hypertension 12/13/2019   Patient denies medical problems 12/05/14   denies    Patient Active Problem List   Diagnosis Date Noted   Insomnia 01/30/2020   Essential hypertension 12/13/2019   Acid reflux 09/06/2019   Otitis externa of right ear 09/06/2019   Visual disturbance 03/29/2019   Paresthesia of both hands 03/01/2019   Scalp cyst 10/27/2018   Health care maintenance 10/19/2018    Past Surgical History:  Procedure Laterality Date   denies     denies surgeries   LESION EXCISION N/A 12/25/2018   Procedure: EXCISION SCALP LESION RIGHT;  Surgeon: Henrene Dodge, MD;  Location: [facility] ORS;  Service: General;  Laterality: N/A;    Prior to Admission medications   Medication Sig Start Date End Date Taking? Authorizing Provider  meloxicam (MOBIC) 15 MG tablet Take 1 tablet (15 mg total) by mouth daily. 02/15/21 03/17/21 Yes Giovani Neumeister B, FNP  amLODipine (NORVASC) 2.5 MG tablet TAKE ONE TABLET BY MOUTH EVERY DAY 12/24/20 12/24/21  Iloabachie, Chioma E, NP  gabapentin (NEURONTIN) 400 MG capsule TAKE ONE CAPSULE BY MOUTH 2 TIMES A DAY 12/24/20 12/24/21  Iloabachie, Chioma E, NP   Multiple Vitamin (MULTIVITAMIN WITH MINERALS) TABS tablet Take 1 tablet by mouth daily.    [provider]  naltrexone (DEPADE) 50 MG tablet TAKE ONE TABLET (50MG  TOTAL) BY MOUTH ONCE DAILY IN THE MORNING. 01/02/21     omeprazole (PRILOSEC) 20 MG capsule TAKE ONE CAPSULE BY MOUTH EVERY DAY 12/24/20 07/09/21  Iloabachie, Chioma E, NP  Oxcarbazepine (TRILEPTAL) 300 MG tablet TAKE 2 TABLETS BY MOUTH ONCE DAILY AT BEDTIME. 11/14/20       Allergies Tramadol  Family History  Problem Relation Age of Onset   Heart failure Mother    Diabetes Mother    Hypertension Father    Other Maternal Grandmother        unknown medical history   Other Maternal Grandfather        unknown medical history   Other Paternal Grandmother        unknown medical history   Other Paternal Grandfather        unknown medical history    Social History Social History   Tobacco Use   Smoking status: Every Day    Packs/day: 0.15    Types: Cigarettes   Smokeless tobacco: Never   Tobacco comments:    3-4 cigarettes a day  Vaping Use   Vaping Use: Never used  Substance Use Topics   Alcohol use: Not Currently    Alcohol/week: 22.0 standard drinks    Types: 21 Cans of beer, 1 Shots of liquor per week  Comment: quit 06/2021 pt states "a lot. whatever I can get.", drinks everyday   Drug use: Not Currently    Types: Cocaine    Comment: last use 2018 (tried cocaine)    Review of Systems  Constitutional: No fever/chills. Eyes: No visual changes. ENT: No sore throat. Cardiovascular: Positive for chest pain.  Positive for pleuritic pain.  Negative for palpitations.  Negative for leg pain. Respiratory: Positive shortness of breath. Gastrointestinal: Negative for abdominal pain.  No nausea, no vomiting.  No diarrhea.  No constipation. Genitourinary: Negative for dysuria. Musculoskeletal: Negative for back pain.  Skin: Negative for rash, lesion, wound. Neurological: Negative for headaches, focal weakness  or numbness  ____________________________________________   PHYSICAL EXAM:  VITAL SIGNS: ED Triage Vitals  Enc Vitals Group     BP 02/15/21 1131 (!) 130/95     Pulse Rate 02/15/21 1131 (!) 101     Resp 02/15/21 1131 20     Temp 02/15/21 1131 98.4 F (36.9 C)     Temp Source 02/15/21 1131 Oral     SpO2 02/15/21 1131 99 %     Weight 02/15/21 1129 145 lb (65.8 kg)     Height 02/15/21 1129 5\' 11"  (1.803 m)     Head Circumference --      Peak Flow --      Pain Score 02/15/21 1129 8     Pain Loc --      Pain Edu? --      Excl. in GC? --     Constitutional: Alert and oriented.  Overall well appearing and in no acute distress.  No mental status. Eyes: Conjunctivae are normal. PERRL. Head: Atraumatic. Nose: No congestion/rhinnorhea. Mouth/Throat: Mucous membranes are moist.  Oropharynx non-erythematous. Tongue normal in size and color. Neck: No stridor.  No carotid bruit appreciated on exam. Hematological/Lymphatic/Immunilogical: No cervical lymphadenopathy. Cardiovascular: Normal rate, regular rhythm. Grossly normal heart sounds.  Good peripheral circulation. Respiratory: Normal respiratory effort.  No retractions. Lungs CTAB. Gastrointestinal: Soft and nontender. No distention. No abdominal bruits. No CVA tenderness. Genitourinary: Exam deferred. Musculoskeletal: No lower extremity tenderness.  No edema of extremities. Neurologic:  Normal speech and language. No gross focal neurologic deficits are appreciated. Skin:  Skin is warm, dry and intact. No rash noted. Psychiatric: Mood and affect are normal. Speech and behavior are normal.  ____________________________________________   LABS (all labs ordered are listed, but only abnormal results are displayed)  Labs Reviewed  BASIC METABOLIC PANEL - Abnormal; Notable for the following components:      Result Value   Glucose, Bld 107 (*)    All other components within normal limits  RESP PANEL BY RT-PCR (FLU A&B, COVID) [id number]   CBC  D-DIMER, QUANTITATIVE  TROPONIN I (HIGH SENSITIVITY)  TROPONIN I (HIGH SENSITIVITY)   ____________________________________________  EKG  ED ECG REPORT I, Kenard Morawski, FNP-BC personally viewed and interpreted this ECG.   Date: 02/15/2021  EKG Time: 1126  Rate: 101  Rhythm: ST with LVH, repolarization abnormality  Axis: normal  Intervals:none  ST&T Change: repolarization abnormality  Unchanged from previous.  ____________________________________________  RADIOLOGY  ED MD interpretation:  Chest x-ray without cardiopulmonary abnormality.  I, 04/17/2021, personally viewed and evaluated these images (plain radiographs) as part of my medical decision making, as well as reviewing the written report by the radiologist.  Official radiology report(s): DG Chest 2 View  Result Date: 02/15/2021 CLINICAL DATA:  Chest pain EXAM: CHEST - 2 VIEW COMPARISON:  08/09/2011 FINDINGS: Normal heart size  and mediastinal contours. No acute infiltrate or edema. No effusion or pneumothorax. No acute osseous findings. IMPRESSION: Negative chest. Electronically Signed   By: Marnee Spring M.D.   On: 02/15/2021 12:02    ____________________________________________   PROCEDURES  Procedure(s) performed: None  Procedures  Critical Care performed: No  ____________________________________________   INITIAL IMPRESSION / ASSESSMENT AND PLAN    40 year old male presenting to the emergency department for treatment and evaluation of midsternal chest pain.  See HPI for further details.  While awaiting ER room assignment, initial labs were collected.  Initial troponin normal.  EKG reviewed and is unchanged from previous.  On exam, pain worsens with deep breath and movement.  D-dimer added.  Plan will be to get a second troponin and if D-dimer is positive CTA chest.  Patient denies personal history of CAD/MI/PE/DVT or family history of cardiac death under 50.  Differential diagnosis includes,  but not limited to:  Differential includes, but is not limited to, viral syndrome, bronchitis including COPD exacerbation, reactive airway disease including asthma, CHF including exacerbation with or without pulmonary/interstitial edema, pneumothorax, ACS, thoracic trauma, and pulmonary embolism, ACS, aortic dissection, pulmonary embolism, cardiac tamponade, pneumothorax, pneumonia, pericarditis, myocarditis, GI-related causes including esophagitis/gastritis, and musculoskeletal chest wall pain.    ED COURSE  D-dimer is negative.  CTA chest to rule out PE not indicated.  PERC negative.  Serial troponins are normal.  Patient reassured by negative results.  He will be treated with anti-inflammatory.  He is to follow-up with primary care or return to the emergency department for symptoms that change or worsen.     FINAL CLINICAL IMPRESSION(S) / ED DIAGNOSES  Final diagnoses:  Acute nonspecific chest pain with low risk of coronary artery disease     ED Discharge Orders          Ordered    meloxicam (MOBIC) 15 MG tablet  Daily        02/15/21 1648             Andrew Long was evaluated in Emergency Department on 02/15/2021 for the symptoms described in the history of present illness. He was evaluated in the context of the global COVID-19 pandemic, which necessitated consideration that the patient might be at risk for infection with the SARS-CoV-2 virus that causes COVID-19. Institutional protocols and algorithms that pertain to the evaluation of patients at risk for COVID-19 are in a state of rapid change based on information released by regulatory bodies including the CDC and federal and state organizations. These policies and algorithms were followed during the patient's care in the ED.   Note:  This document was prepared using Dragon voice recognition software and may include unintentional dictation errors.    Chinita Pester, FNP 02/15/21 1859    Sharyn Creamer, MD 02/15/21  2046

## 2021-02-15 NOTE — ED Notes (Signed)
See triage note  Presents with chest pain  States pain woke him up around 9 am  Also had some numbness to arm but states numbness is gone

## 2021-02-25 ENCOUNTER — Telehealth: Payer: Self-pay

## 2021-02-25 NOTE — Telephone Encounter (Signed)
Pt scheduled for appt 02/26/2021 at 7PM. Pt wants to discuss his rx being transferred to [facility]. Gave him instructions on how to do this but he will likely need additional assistance at appointment.

## 2021-02-26 ENCOUNTER — Ambulatory Visit: Payer: Self-pay | Admitting: Family Medicine

## 2021-02-26 ENCOUNTER — Other Ambulatory Visit: Payer: Self-pay

## 2021-02-26 VITALS — BP 137/87 | HR 118 | Temp 98.2°F | Resp 18 | Ht 71.0 in | Wt 134.1 lb

## 2021-02-26 DIAGNOSIS — K219 Gastro-esophageal reflux disease without esophagitis: Secondary | ICD-10-CM

## 2021-02-26 DIAGNOSIS — R079 Chest pain, unspecified: Secondary | ICD-10-CM

## 2021-02-26 DIAGNOSIS — K0889 Other specified disorders of teeth and supporting structures: Secondary | ICD-10-CM

## 2021-02-26 DIAGNOSIS — R000 Tachycardia, unspecified: Secondary | ICD-10-CM | POA: Insufficient documentation

## 2021-02-26 DIAGNOSIS — I10 Essential (primary) hypertension: Secondary | ICD-10-CM

## 2021-02-26 MED ORDER — AMOXICILLIN-POT CLAVULANATE 875-125 MG PO TABS: 1.0000 | ORAL_TABLET | Freq: Two times a day (BID) | ORAL | 0 refills | Status: DC

## 2021-02-26 NOTE — Patient Instructions (Addendum)
Your heart rate was fast.  Come back in 1 week.  If you have chest pain or trouble breathing go to the hospital.  We'll get labs soon, come back for these.  We'll check your thyroid.  We'll give you an antibiotic for your tooth pain.  We'll work on getting you into see the dentis.

## 2021-02-26 NOTE — Progress Notes (Signed)
Problem List Items Addressed This Visit       Cardiovascular and Mediastinum   Essential hypertension    meds refilled      Relevant Medications   amLODipine (NORVASC) 2.5 MG tablet     Digestive   Acid reflux    meds refilled      Relevant Medications   omeprazole (PRILOSEC) 20 MG capsule     Other   Tachycardia - Primary    EKG at ED showed sinus tachy, appeared similar to priors Declined repeat EKG here today Differential broad? Related to caffeine from BC's? Pain/infection related to his tooth? Negative troponin/d dimer at last visit making PE less likely (and no CP/doe/SOB).  Will work up today with TSH, CBC, CMP Encourage hydration  Consider additional w/u with echo +/- cardiology referral if persistent      Relevant Orders   TSH (Completed)   Comp Met (CMET) (Completed)   CBC w/Diff (Completed)   Tooth pain    Right lower tooth broken Pain  No abscess noted Will give course of abx We're going to assist with getting to dentist      Chest pain    Seen in ED for CP, this is resolved now Negative trop, d dimer in ED.  CXR negative.   EKG appeared similar to priors. He thinks this was related to stress, family member in hospital for Andrew Long while, just got home, thankfully.   Continue to monitor W/u further in concert with tachy as noted          Established Patient Office Visit  Subjective:  Patient ID: Andrew Long, male    DOB: 1980-10-25  Age: 40 y.o. MRN: [id number]  CC:  Chief Complaint  Patient presents with   Follow-up   Hypertension    Patient has been out of Amlodipine for 7-10 days.   Gastroesophageal Reflux    Patient has been out of Omeprazole x 3 weeks   Dental Pain    Patient c/o dental pain x 2 days. Patient has taking OTC BC Powders. Patient requesting pain medication. Patient inquiring about dental referral (orange card)    HPI Andrew Long presents for   BP mediciation - no CP, SOB  Acid reflex - asking for refill  Tooth  ache - right side - ibuprofen, bc's.  He's careful not to take too many or together - sounds like he's taking appropriately.  No abscess/drainage.  Tachycardia - heat insensitivity, diarrhea.  Denies CP, denies SOB, denies DOE.  Seen in ED for chest pain - family member just in hospital - he thinks this was stress related  Needs to change pharmacy - wants [facility]   Past Medical History:  Diagnosis Date   Acid reflux 09/06/2019   Allergy    Depression    Essential hypertension 12/13/2019   Patient denies medical problems 12/05/14   denies    Past Surgical History:  Procedure Laterality Date   denies     denies surgeries   LESION EXCISION N/Andrew Long 12/25/2018   Procedure: EXCISION SCALP LESION RIGHT;  Surgeon: Andrew Ree, MD;  Location: [facility] ORS;  Service: General;  Laterality: N/Andrew Long;    Family History  Problem Relation Age of Onset   Heart failure Mother    Diabetes Mother    Hypertension Father    Other Maternal Grandmother        unknown medical history   Other Maternal Grandfather        unknown medical history  Other Paternal Grandmother        unknown medical history   Other Paternal Grandfather        unknown medical history    Social History   Socioeconomic History   Marital status: Single    Spouse name: Not on file   Number of children: 2   Years of education: Not on file   Highest education level: Not on file  Occupational History   Not on file  Tobacco Use   Smoking status: Every Day    Packs/day: 0.15    Types: Cigarettes   Smokeless tobacco: Never   Tobacco comments:    3-4 cigarettes Andrew Long day    Patient doesn't drink heavy anymore, but does have 3-4 beers per week  Vaping Use   Vaping Use: Never used  Substance and Sexual Activity   Alcohol use: Yes    Alcohol/week: 4.0 standard drinks    Types: 4 Cans of beer per week    Comment: quit 06/2020 pt states "[name] lot. whatever I can get.", drinks everyday   Drug use: Not Currently    Types: Cocaine     Comment: last use 2018 (tried cocaine)   Sexual activity: Yes    Birth control/protection: Condom  Other Topics Concern   Not on file  Social History Narrative   Not on file   Social Determinants of Health   Financial Resource Strain: Not on file  Food Insecurity: Food Insecurity Present   Worried About Andrew Long in the Last Year: Sometimes true   Ran Out of Food in the Last Year: Sometimes true  Transportation Needs: No Transportation Needs   Lack of Transportation (Medical): No   Lack of Transportation (Non-Medical): No  Physical Activity: Not on file  Stress: Not on file  Social Connections: Not on file  Intimate Partner Violence: Not on file    Outpatient Medications Prior to Visit  Medication Sig Dispense Refill   gabapentin (NEURONTIN) 400 MG capsule TAKE ONE CAPSULE BY MOUTH 2 TIMES Andrew Long DAY 60 capsule 2   meloxicam (MOBIC) 15 MG tablet Take 1 tablet (15 mg total) by mouth daily. 30 tablet 0   Multiple Vitamin (MULTIVITAMIN WITH MINERALS) TABS tablet Take 1 tablet by mouth daily.     Oxcarbazepine (TRILEPTAL) 300 MG tablet TAKE 2 TABLETS BY MOUTH ONCE DAILY AT BEDTIME. 60 tablet 0   amLODipine (NORVASC) 2.5 MG tablet TAKE ONE TABLET BY MOUTH EVERY DAY 90 tablet 1   naltrexone (DEPADE) 50 MG tablet TAKE ONE TABLET ($RemoveBef'50MG'RdalTfXDED$  TOTAL) BY MOUTH ONCE DAILY IN THE MORNING. (Patient not taking: Reported on 02/26/2021) 30 tablet 0   omeprazole (PRILOSEC) 20 MG capsule TAKE ONE CAPSULE BY MOUTH EVERY DAY (Patient not taking: Reported on 02/26/2021) 30 capsule 3   No facility-administered medications prior to visit.    Allergies  Allergen Reactions   Tramadol Nausea And Vomiting    ROS Review of Systems As above   Objective:    Physical Exam Constitutional:      Appearance: Normal appearance.  Neck:     Comments: Thyroid without obvious goiter Cardiovascular:     Rate and Rhythm: Regular rhythm. Tachycardia present.     Pulses: Normal pulses.  Pulmonary:      Effort: Pulmonary effort is normal. No respiratory distress.     Breath sounds: Normal breath sounds.  Musculoskeletal:     Cervical back: Normal range of motion.  Skin:    General: Skin is warm and  dry.  Neurological:     General: No focal deficit present.     Mental Status: He is alert.  Psychiatric:        Mood and Affect: Mood normal.        Behavior: Behavior normal.    BP 137/87 (BP Location: Left Arm, Patient Position: Sitting, Cuff Size: Large)   Pulse (!) 118   Temp 98.2 F (36.8 C)   Resp 18   Ht _0  (1.803 m)   Wt 134 lb 1.6 oz (60.8 kg)   SpO2 94%   BMI 18.70 kg/m  Wt Readings from Last 3 Encounters:  02/26/21 134 lb 1.6 oz (60.8 kg)  02/15/21 145 lb (65.8 kg)  12/24/20 142 lb 9.6 oz (64.7 kg)     Health Maintenance Due  Topic Date Due   Pneumococcal Vaccine 24-43 Years old (1 - PCV) Never done   HIV Screening  Never done   Hepatitis C Screening  Never done   TETANUS/TDAP  Never done   COVID-19 Vaccine (4 - Booster for Pfizer series) 09/25/2020   INFLUENZA VACCINE  02/09/2021    There are no preventive care reminders to display for this patient.  Lab Results  Component Value Date   TSH 0.700 02/26/2021   Lab Results  Component Value Date   WBC 10.1 02/26/2021   HGB 15.7 02/26/2021   HCT 47.0 02/26/2021   MCV 87 02/26/2021   PLT 407 02/26/2021   Lab Results  Component Value Date   NA 141 02/26/2021   K 4.6 02/26/2021   CO2 19 (L) 02/26/2021   GLUCOSE 91 02/26/2021   BUN 7 02/26/2021   CREATININE 0.71 (L) 02/26/2021   BILITOT 0.5 02/26/2021   ALKPHOS 95 02/26/2021   AST 29 02/26/2021   ALT 17 02/26/2021   PROT 8.1 02/26/2021   ALBUMIN 5.1 (H) 02/26/2021   CALCIUM 9.4 02/26/2021   ANIONGAP 9 02/15/2021   EGFR 121 02/26/2021   Lab Results  Component Value Date   CHOL 126 12/24/2020   Lab Results  Component Value Date   HDL 47 12/24/2020   Lab Results  Component Value Date   LDLCALC 61 12/24/2020   Lab Results  Component  Value Date   TRIG 94 12/24/2020   Lab Results  Component Value Date   CHOLHDL 2.7 12/24/2020   Lab Results  Component Value Date   HGBA1C 5.3 12/24/2020      Assessment & Plan:   Problem List Items Addressed This Visit       Cardiovascular and Mediastinum   Essential hypertension    meds refilled      Relevant Medications   amLODipine (NORVASC) 2.5 MG tablet     Digestive   Acid reflux    meds refilled      Relevant Medications   omeprazole (PRILOSEC) 20 MG capsule     Other   Tachycardia - Primary    EKG at ED showed sinus tachy, appeared similar to priors Declined repeat EKG here today Differential broad? Related to caffeine from BC's? Pain/infection related to his tooth? Negative troponin/d dimer at last visit making PE less likely (and no CP/doe/SOB).  Will work up today with TSH, CBC, CMP Encourage hydration  Consider additional w/u with echo +/- cardiology referral if persistent      Relevant Orders   TSH (Completed)   Comp Met (CMET) (Completed)   CBC w/Diff (Completed)   Tooth pain    Right lower tooth broken Pain  No abscess noted Will give course of abx We're going to assist with getting to dentist      Chest pain    Seen in ED for CP, this is resolved now Negative trop, d dimer in ED.  CXR negative.   EKG appeared similar to priors. He thinks this was related to stress, family member in hospital for Sahithi Ordoyne while, just got home, thankfully.   Continue to monitor W/u further in concert with tachy as noted         Meds ordered this encounter  Medications   amoxicillin-clavulanate (AUGMENTIN) 875-125 MG tablet    Sig: Take 1 tablet by mouth 2 (two) times daily for 10 days.    Dispense:  20 tablet    Refill:  0   amLODipine (NORVASC) 2.5 MG tablet    Sig: TAKE ONE TABLET BY MOUTH EVERY DAY    Dispense:  90 tablet    Refill:  1   omeprazole (PRILOSEC) 20 MG capsule    Sig: TAKE ONE CAPSULE BY MOUTH EVERY DAY    Dispense:  30 capsule     Refill:  3    Follow-up: Return in about 1 week (around 03/05/2021).    Fayrene Helper, MD

## 2021-02-27 DIAGNOSIS — R079 Chest pain, unspecified: Secondary | ICD-10-CM | POA: Insufficient documentation

## 2021-02-27 DIAGNOSIS — K0889 Other specified disorders of teeth and supporting structures: Secondary | ICD-10-CM | POA: Insufficient documentation

## 2021-02-27 LAB — COMPREHENSIVE METABOLIC PANEL
ALT: 17 IU/L (ref 0–44)
AST: 29 IU/L (ref 0–40)
Albumin/Globulin Ratio: 1.7 (ref 1.2–2.2)
Albumin: 5.1 g/dL — ABNORMAL HIGH (ref 4.0–5.0)
Alkaline Phosphatase: 95 IU/L (ref 44–121)
BUN/Creatinine Ratio: 10 (ref 9–20)
BUN: 7 mg/dL (ref 6–20)
Bilirubin Total: 0.5 mg/dL (ref 0.0–1.2)
CO2: 19 mmol/L — ABNORMAL LOW (ref 20–29)
Calcium: 9.4 mg/dL (ref 8.7–10.2)
Chloride: 97 mmol/L (ref 96–106)
Creatinine, Ser: 0.71 mg/dL — ABNORMAL LOW (ref 0.76–1.27)
Globulin, Total: 3 g/dL (ref 1.5–4.5)
Glucose: 91 mg/dL (ref 65–99)
Potassium: 4.6 mmol/L (ref 3.5–5.2)
Sodium: 141 mmol/L (ref 134–144)
Total Protein: 8.1 g/dL (ref 6.0–8.5)
eGFR: 121 mL/min/{1.73_m2} (ref >59)

## 2021-02-27 LAB — CBC WITH DIFFERENTIAL/PLATELET
Basophils Absolute: 0.1 10*3/uL (ref 0.0–0.2)
Basos: 1 %
EOS (ABSOLUTE): 0.1 10*3/uL (ref 0.0–0.4)
Eos: 1 %
Hematocrit: 47 % (ref 37.5–51.0)
Hemoglobin: 15.7 g/dL (ref 13.0–17.7)
Immature Grans (Abs): 0 10*3/uL (ref 0.0–0.1)
Immature Granulocytes: 0 %
Lymphocytes Absolute: 3.3 10*3/uL — ABNORMAL HIGH (ref 0.7–3.1)
Lymphs: 33 %
MCH: 29.2 pg (ref 26.6–33.0)
MCHC: 33.4 g/dL (ref 31.5–35.7)
MCV: 87 fL (ref 79–97)
Monocytes Absolute: 0.7 10*3/uL (ref 0.1–0.9)
Monocytes: 7 %
Neutrophils Absolute: 5.8 10*3/uL (ref 1.4–7.0)
Neutrophils: 58 %
Platelets: 407 10*3/uL (ref 150–450)
RBC: 5.38 x10E6/uL (ref 4.14–5.80)
RDW: 15.7 % — ABNORMAL HIGH (ref 11.6–15.4)
WBC: 10.1 10*3/uL (ref 3.4–10.8)

## 2021-02-27 LAB — TSH: TSH: 0.7 u[IU]/mL (ref 0.450–4.500)

## 2021-02-27 MED ORDER — AMLODIPINE BESYLATE 2.5 MG PO TABS: ORAL_TABLET | Freq: Every day | ORAL | 1 refills | Status: DC

## 2021-02-27 MED ORDER — OMEPRAZOLE 20 MG PO CPDR: DELAYED_RELEASE_CAPSULE | Freq: Every day | ORAL | 3 refills | Status: DC

## 2021-02-27 NOTE — Assessment & Plan Note (Addendum)
EKG at ED showed sinus tachy, appeared similar to priors Declined repeat EKG here today Differential broad? Related to caffeine from BC's? Pain/infection related to his tooth? Negative troponin/d dimer at last visit making PE less likely (and no CP/doe/SOB).  Will work up today with TSH, CBC, CMP Encourage hydration  Consider additional w/u with echo +/- cardiology referral if persistent

## 2021-02-27 NOTE — Assessment & Plan Note (Addendum)
Right lower tooth broken Pain  No abscess noted Will give course of abx We're going to assist with getting to dentist

## 2021-02-27 NOTE — Assessment & Plan Note (Signed)
Seen in ED for CP, this is resolved now Negative trop, d dimer in ED.  CXR negative.   EKG appeared similar to priors. He thinks this was related to stress, family member in hospital for Andrew Long while, just got home, thankfully.   Continue to monitor W/u further in concert with tachy as noted

## 2021-02-27 NOTE — Assessment & Plan Note (Signed)
meds refilled 

## 2021-03-04 ENCOUNTER — Ambulatory Visit: Payer: Self-pay | Admitting: Gerontology

## 2021-03-04 ENCOUNTER — Encounter: Payer: Self-pay | Admitting: Gerontology

## 2021-03-04 ENCOUNTER — Other Ambulatory Visit: Payer: Self-pay

## 2021-03-04 VITALS — BP 129/82 | HR 88 | Temp 98.1°F | Resp 16 | Ht 71.0 in | Wt 138.6 lb

## 2021-03-04 DIAGNOSIS — I10 Essential (primary) hypertension: Secondary | ICD-10-CM

## 2021-03-04 DIAGNOSIS — K0889 Other specified disorders of teeth and supporting structures: Secondary | ICD-10-CM

## 2021-03-04 MED ORDER — AMLODIPINE BESYLATE 2.5 MG PO TABS: ORAL_TABLET | Freq: Every day | ORAL | 1 refills | Status: DC

## 2021-03-04 MED ORDER — AMOXICILLIN-POT CLAVULANATE 875-125 MG PO TABS: 1.0000 | ORAL_TABLET | Freq: Two times a day (BID) | ORAL | 0 refills | Status: AC

## 2021-03-04 NOTE — Patient Instructions (Signed)
https://www.nhlbi.nih.gov/files/docs/public/heart/dash_brief.pdf">  DASH Eating Plan DASH stands for Dietary Approaches to Stop Hypertension. The DASH eating plan is a healthy eating plan that has been shown to: Reduce high blood pressure (hypertension). Reduce your risk for type 2 diabetes, heart disease, and stroke. Help with weight loss. What are tips for following this plan? Reading food labels Check food labels for the amount of salt (sodium) per serving. Choose foods with less than 5 percent of the Daily Value of sodium. Generally, foods with less than 300 milligrams (mg) of sodium per serving fit into this eating plan. To find whole grains, look for the word "whole" as the first word in the ingredient list. Shopping Buy products labeled as "low-sodium" or "no salt added." Buy fresh foods. Avoid canned foods and pre-made or frozen meals. Cooking Avoid adding salt when cooking. Use salt-free seasonings or herbs instead of table salt or sea salt. Check with your health care provider or pharmacist before using salt substitutes. Do not fry foods. Cook foods using healthy methods such as baking, boiling, grilling, roasting, and broiling instead. Cook with heart-healthy oils, such as olive, canola, avocado, soybean, or sunflower oil. Meal planning  Eat a balanced diet that includes: 4 or more servings of fruits and 4 or more servings of vegetables each day. Try to fill one-half of your plate with fruits and vegetables. 6-8 servings of whole grains each day. Less than 6 oz (170 g) of lean meat, poultry, or fish each day. A 3-oz (85-g) serving of meat is about the same size as a deck of cards. One egg equals 1 oz (28 g). 2-3 servings of low-fat dairy each day. One serving is 1 cup (237 mL). 1 serving of nuts, seeds, or beans 5 times each week. 2-3 servings of heart-healthy fats. Healthy fats called omega-3 fatty acids are found in foods such as walnuts, flaxseeds, fortified milks, and eggs.  These fats are also found in cold-water fish, such as sardines, salmon, and mackerel. Limit how much you eat of: Canned or prepackaged foods. Food that is high in trans fat, such as some fried foods. Food that is high in saturated fat, such as fatty meat. Desserts and other sweets, sugary drinks, and other foods with added sugar. Full-fat dairy products. Do not salt foods before eating. Do not eat more than 4 egg yolks a week. Try to eat at least 2 vegetarian meals a week. Eat more home-cooked food and less restaurant, buffet, and fast food.  Lifestyle When eating at a restaurant, ask that your food be prepared with less salt or no salt, if possible. If you drink alcohol: Limit how much you use to: 0-1 drink a day for women who are not pregnant. 0-2 drinks a day for men. Be aware of how much alcohol is in your drink. In the [place] one drink equals one 12 oz bottle of beer (355 mL), one 5 oz glass of wine (148 mL), or one 1 oz glass of hard liquor (44 mL). General information Avoid eating more than 2,300 mg of salt a day. If you have hypertension, you may need to reduce your sodium intake to 1,500 mg a day. Work with your health care provider to maintain a healthy body weight or to lose weight. Ask what an ideal weight is for you. Get at least 30 minutes of exercise that causes your heart to beat faster (aerobic exercise) most days of the week. Activities may include walking, swimming, or biking. Work with your health care provider   or dietitian to adjust your eating plan to your individual calorie needs. What foods should I eat? Fruits All fresh, dried, or frozen fruit. Canned fruit in natural juice (without addedsugar). Vegetables Fresh or frozen vegetables (raw, steamed, roasted, or grilled). Low-sodium or reduced-sodium tomato and vegetable juice. Low-sodium or reduced-sodium tomatosauce and tomato paste. Low-sodium or reduced-sodium canned vegetables. Grains Whole-grain or  whole-wheat bread. Whole-grain or whole-wheat pasta. Brown rice. Oatmeal. Quinoa. Bulgur. Whole-grain and low-sodium cereals. Pita bread.Low-fat, low-sodium crackers. Whole-wheat flour tortillas. Meats and other proteins Skinless chicken or turkey. Ground chicken or turkey. Pork with fat trimmed off. Fish and seafood. Egg whites. Dried beans, peas, or lentils. Unsalted nuts, nut butters, and seeds. Unsalted canned beans. Lean cuts of beef with fat trimmed off. Low-sodium, lean precooked or cured meat, such as sausages or meatloaves. Dairy Low-fat (1%) or fat-free (skim) milk. Reduced-fat, low-fat, or fat-free cheeses. Nonfat, low-sodium ricotta or cottage cheese. Low-fat or nonfatyogurt. Low-fat, low-sodium cheese. Fats and oils Soft margarine without trans fats. Vegetable oil. Reduced-fat, low-fat, or light mayonnaise and salad dressings (reduced-sodium). Canola, safflower, olive, avocado, soybean, andsunflower oils. Avocado. Seasonings and condiments Herbs. Spices. Seasoning mixes without salt. Other foods Unsalted popcorn and pretzels. Fat-free sweets. The items listed above may not be a complete list of foods and beverages you can eat. Contact a dietitian for more information. What foods should I avoid? Fruits Canned fruit in a light or heavy syrup. Fried fruit. Fruit in cream or buttersauce. Vegetables Creamed or fried vegetables. Vegetables in a cheese sauce. Regular canned vegetables (not low-sodium or reduced-sodium). Regular canned tomato sauce and paste (not low-sodium or reduced-sodium). Regular tomato and vegetable juice(not low-sodium or reduced-sodium). Pickles. Olives. Grains Baked goods made with fat, such as croissants, muffins, or some breads. Drypasta or rice meal packs. Meats and other proteins Fatty cuts of meat. Ribs. Fried meat. Bacon. Bologna, salami, and other precooked or cured meats, such as sausages or meat loaves. Fat from the back of a pig (fatback). Bratwurst.  Salted nuts and seeds. Canned beans with added salt. Canned orsmoked fish. Whole eggs or egg yolks. Chicken or turkey with skin. Dairy Whole or 2% milk, cream, and half-and-half. Whole or full-fat cream cheese. Whole-fat or sweetened yogurt. Full-fat cheese. Nondairy creamers. Whippedtoppings. Processed cheese and cheese spreads. Fats and oils Butter. Stick margarine. Lard. Shortening. Ghee. Bacon fat. Tropical oils, suchas coconut, palm kernel, or palm oil. Seasonings and condiments Onion salt, garlic salt, seasoned salt, table salt, and sea salt. Worcestershire sauce. Tartar sauce. Barbecue sauce. Teriyaki sauce. Soy sauce, including reduced-sodium. Steak sauce. Canned and packaged gravies. Fish sauce. Oyster sauce. Cocktail sauce. Store-bought horseradish. Ketchup. Mustard. Meat flavorings and tenderizers. Bouillon cubes. Hot sauces. Pre-made or packaged marinades. Pre-made or packaged taco seasonings. Relishes. Regular saladdressings. Other foods Salted popcorn and pretzels. The items listed above may not be a complete list of foods and beverages you should avoid. Contact a dietitian for more information. Where to find more information National Heart, Lung, and Blood Institute: www.nhlbi.nih.gov American Heart Association: www.heart.org Academy of Nutrition and Dietetics: www.eatright.org National Kidney Foundation: www.kidney.org Summary The DASH eating plan is a healthy eating plan that has been shown to reduce high blood pressure (hypertension). It may also reduce your risk for type 2 diabetes, heart disease, and stroke. When on the DASH eating plan, aim to eat more fresh fruits and vegetables, whole grains, lean proteins, low-fat dairy, and heart-healthy fats. With the DASH eating plan, you should limit salt (sodium) intake to 2,300   mg a day. If you have hypertension, you may need to reduce your sodium intake to 1,500 mg a day. Work with your health care provider or dietitian to adjust  your eating plan to your individual calorie needs. This information is not intended to replace advice given to you by your health care provider. Make sure you discuss any questions you have with your healthcare provider. Document Revised: 06/01/2019 Document Reviewed: 06/01/2019 Elsevier Patient Education  2022 Elsevier Inc.  

## 2021-03-04 NOTE — Progress Notes (Signed)
Established Patient Office Visit  Subjective:  Patient ID: Andrew Long, male    DOB: Oct 18, 1980  Age: 40 y.o. MRN: [id number]  CC:  Chief Complaint  Patient presents with   Follow-up   Tachycardia    Patient has not been able to afford his HTN meds.    HPI Andrew Long is a 40 year old male who has history of acid reflux, allergy, depression, hypertension, presents for follow up visit. He was seen on 02/26/21 for Tachycardia and tooth abscess. He states that he couldn't afford his Augmentin for his tooth abscess and unable to pick up from [facility] because he has not submitted his financial information. He states that he continues to experience intermittent pain to his right lower mandibular tooth and taking Aleve minimally relieves symptoms. He also states that he doesn't have $20 for the Dental referral. He was seen at the ED on 02/15/21 for chest pain and shortness of breath which he states that he took 6 packets of BC powder for his tooth ache. Currently, he denies chest pain, palpitation, dizziness and shortness of breath. Overall, he states that he's doing well and offers no further complaint.  Past Medical History:  Diagnosis Date   Acid reflux 09/06/2019   Allergy    Depression    Essential hypertension 12/13/2019   Patient denies medical problems 12/05/14   denies    Past Surgical History:  Procedure Laterality Date   denies     denies surgeries   LESION EXCISION N/A 12/25/2018   Procedure: EXCISION SCALP LESION RIGHT;  Surgeon: [name], MD;  Location: [facility] ORS;  Service: General;  Laterality: N/A;    Family History  Problem Relation Age of Onset   Heart failure Mother    Diabetes Mother    Hypertension Father    Other Maternal Grandmother        unknown medical history   Other Maternal Grandfather        unknown medical history   Other Paternal Grandmother        unknown medical history   Other Paternal Grandfather        unknown medical history     Social History   Socioeconomic History   Marital status: Single    Spouse name: Not on file   Number of children: 2   Years of education: Not on file   Highest education level: Not on file  Occupational History   Not on file  Tobacco Use   Smoking status: Every Day    Packs/day: 0.15    Types: Cigarettes   Smokeless tobacco: Never   Tobacco comments:    3-4 cigarettes a day    Patient doesn't drink heavy anymore, but does have 3-4 beers per week  Vaping Use   Vaping Use: Never used  Substance and Sexual Activity   Alcohol use: Yes    Alcohol/week: 4.0 standard drinks    Types: 4 Cans of beer per week    Comment: quit 06/2020 pt states "a lot. whatever I can get.", drinks everyday   Drug use: Not Currently    Types: Cocaine    Comment: last use 2018 (tried cocaine)   Sexual activity: Yes    Birth control/protection: Condom  Other Topics Concern   Not on file  Social History Narrative   Not on file   Social Determinants of Health   Financial Resource Strain: Not on file  Food Insecurity: Food Insecurity Present   Worried About Running  Out of Food in the Last Year: Sometimes true   Ran Out of Food in the Last Year: Sometimes true  Transportation Needs: No Transportation Needs   Lack of Transportation (Medical): No   Lack of Transportation (Non-Medical): No  Physical Activity: Not on file  Stress: Not on file  Social Connections: Not on file  Intimate Partner Violence: Not on file    Outpatient Medications Prior to Visit  Medication Sig Dispense Refill   gabapentin (NEURONTIN) 400 MG capsule TAKE ONE CAPSULE BY MOUTH 2 TIMES A DAY 60 capsule 2   Multiple Vitamin (MULTIVITAMIN WITH MINERALS) TABS tablet Take 1 tablet by mouth daily.     Oxcarbazepine (TRILEPTAL) 300 MG tablet TAKE 2 TABLETS BY MOUTH ONCE DAILY AT BEDTIME. 60 tablet 0   meloxicam (MOBIC) 15 MG tablet Take 1 tablet (15 mg total) by mouth daily. 30 tablet 0   naltrexone (DEPADE) 50 MG tablet TAKE  ONE TABLET (50MG TOTAL) BY MOUTH ONCE DAILY IN THE MORNING. (Patient not taking: No sig reported) 30 tablet 0   omeprazole (PRILOSEC) 20 MG capsule TAKE ONE CAPSULE BY MOUTH EVERY DAY (Patient not taking: Reported on 03/04/2021) 30 capsule 3   amLODipine (NORVASC) 2.5 MG tablet TAKE ONE TABLET BY MOUTH EVERY DAY (Patient not taking: Reported on 03/04/2021) 90 tablet 1   amoxicillin-clavulanate (AUGMENTIN) 875-125 MG tablet Take 1 tablet by mouth 2 (two) times daily for 10 days. (Patient not taking: Reported on 03/04/2021) 20 tablet 0   No facility-administered medications prior to visit.    Allergies  Allergen Reactions   Tramadol Nausea And Vomiting    ROS Review of Systems  Constitutional: Negative.  Negative for activity change.  HENT:  Positive for dental problem.   Eyes: Negative.   Respiratory: Negative.    Cardiovascular: Negative.   Neurological: Negative.   Psychiatric/Behavioral: Negative.       Objective:    Physical Exam HENT:     Mouth/Throat:     Dentition: Dental caries (right lower, mild erythema and swelling) present.  Eyes:     Extraocular Movements: Extraocular movements intact.     Conjunctiva/sclera: Conjunctivae normal.     Pupils: Pupils are equal, round, and reactive to light.  Cardiovascular:     Rate and Rhythm: Normal rate and regular rhythm.     Pulses: Normal pulses.     Heart sounds: Normal heart sounds.  Pulmonary:     Effort: Pulmonary effort is normal.     Breath sounds: Normal breath sounds.  Neurological:     General: No focal deficit present.     Mental Status: He is alert and oriented to person, place, and time. Mental status is at baseline.    BP 129/82 (BP Location: Left Arm, Patient Position: Sitting, Cuff Size: Normal)   Pulse 88   Temp 98.1 F (36.7 C)   Resp 16   Ht 5' 11" (1.803 m)   Wt 138 lb 9.6 oz (62.9 kg)   SpO2 96%   BMI 19.33 kg/m  Wt Readings from Last 3 Encounters:  03/04/21 138 lb 9.6 oz (62.9 kg)  02/26/21  134 lb 1.6 oz (60.8 kg)  02/15/21 145 lb (65.8 kg)     Health Maintenance Due  Topic Date Due   Pneumococcal Vaccine 24-72 Years old (1 - PCV) Never done   HIV Screening  Never done   Hepatitis C Screening  Never done   TETANUS/TDAP  Never done   COVID-19 Vaccine (4 - Booster  for [place] series) 09/25/2020   INFLUENZA VACCINE  02/09/2021    There are no preventive care reminders to display for this patient.  Lab Results  Component Value Date   TSH 0.700 02/26/2021   Lab Results  Component Value Date   WBC 10.1 02/26/2021   HGB 15.7 02/26/2021   HCT 47.0 02/26/2021   MCV 87 02/26/2021   PLT 407 02/26/2021   Lab Results  Component Value Date   NA 141 02/26/2021   K 4.6 02/26/2021   CO2 19 (L) 02/26/2021   GLUCOSE 91 02/26/2021   BUN 7 02/26/2021   CREATININE 0.71 (L) 02/26/2021   BILITOT 0.5 02/26/2021   ALKPHOS 95 02/26/2021   AST 29 02/26/2021   ALT 17 02/26/2021   PROT 8.1 02/26/2021   ALBUMIN 5.1 (H) 02/26/2021   CALCIUM 9.4 02/26/2021   ANIONGAP 9 02/15/2021   EGFR 121 02/26/2021   Lab Results  Component Value Date   CHOL 126 12/24/2020   Lab Results  Component Value Date   HDL 47 12/24/2020   Lab Results  Component Value Date   LDLCALC 61 12/24/2020   Lab Results  Component Value Date   TRIG 94 12/24/2020   Lab Results  Component Value Date   CHOLHDL 2.7 12/24/2020   Lab Results  Component Value Date   HGBA1C 5.3 12/24/2020      Assessment & Plan:    1. Essential hypertension -His blood pressure is under control, he will continue on current medication and DASH diet. - amLODipine (NORVASC) 2.5 MG tablet; TAKE ONE TABLET BY MOUTH EVERY DAY  Dispense: 90 tablet; Refill: 1  2. Tooth pain -He was advised to pick up Augmentin from [facility], he was educated on medication side effects and advised to notify clinic or go to the emergency room.  He was encouraged to complete dental application for referral. -  amoxicillin-clavulanate (AUGMENTIN) 875-125 MG tablet; Take 1 tablet by mouth 2 (two) times daily for 10 days.  Dispense: 20 tablet; Refill: 0     Follow-up: Return in about 3 months (around 06/02/2021).    Chioma Jerold Coombe, NP

## 2021-03-05 ENCOUNTER — Other Ambulatory Visit: Payer: Self-pay

## 2021-03-05 DIAGNOSIS — I10 Essential (primary) hypertension: Secondary | ICD-10-CM

## 2021-03-23 ENCOUNTER — Other Ambulatory Visit: Payer: Self-pay | Admitting: Gerontology

## 2021-03-23 DIAGNOSIS — K219 Gastro-esophageal reflux disease without esophagitis: Secondary | ICD-10-CM

## 2021-03-26 ENCOUNTER — Ambulatory Visit: Payer: Self-pay | Admitting: Gerontology

## 2021-04-04 ENCOUNTER — Emergency Department: Payer: Self-pay

## 2021-04-04 ENCOUNTER — Other Ambulatory Visit: Payer: Self-pay

## 2021-04-04 ENCOUNTER — Emergency Department
Admission: EM | Admit: 2021-04-04 | Discharge: 2021-04-04 | Disposition: A | Discharge status: Home / Self Care | Payer: Self-pay | Attending: Emergency Medicine | Admitting: Emergency Medicine

## 2021-04-04 DIAGNOSIS — Z23 Encounter for immunization: Secondary | ICD-10-CM | POA: Insufficient documentation

## 2021-04-04 DIAGNOSIS — F17210 Nicotine dependence, cigarettes, uncomplicated: Secondary | ICD-10-CM | POA: Insufficient documentation

## 2021-04-04 DIAGNOSIS — Z79899 Other long term (current) drug therapy: Secondary | ICD-10-CM | POA: Insufficient documentation

## 2021-04-04 DIAGNOSIS — S0181XA Laceration without foreign body of other part of head, initial encounter: Secondary | ICD-10-CM | POA: Insufficient documentation

## 2021-04-04 DIAGNOSIS — F10129 Alcohol abuse with intoxication, unspecified: Secondary | ICD-10-CM | POA: Insufficient documentation

## 2021-04-04 DIAGNOSIS — F1010 Alcohol abuse, uncomplicated: Secondary | ICD-10-CM

## 2021-04-04 DIAGNOSIS — I10 Essential (primary) hypertension: Secondary | ICD-10-CM | POA: Insufficient documentation

## 2021-04-04 LAB — URINALYSIS, ROUTINE W REFLEX MICROSCOPIC
Bacteria, UA: NONE SEEN
Bilirubin Urine: NEGATIVE
Glucose, UA: NEGATIVE mg/dL
Ketones, ur: NEGATIVE mg/dL
Leukocytes,Ua: NEGATIVE
Nitrite: NEGATIVE
Protein, ur: NEGATIVE mg/dL
Specific Gravity, Urine: 1.003 — ABNORMAL LOW (ref 1.005–1.030)
Squamous Epithelial / HPF: NONE SEEN (ref 0–5)
pH: 6 (ref 5.0–8.0)

## 2021-04-04 LAB — CBC WITH DIFFERENTIAL/PLATELET
Abs Immature Granulocytes: 0.03 10*3/uL (ref 0.00–0.07)
Basophils Absolute: 0.1 10*3/uL (ref 0.0–0.1)
Basophils Relative: 1 %
Eosinophils Absolute: 0.1 10*3/uL (ref 0.0–0.5)
Eosinophils Relative: 1 %
HCT: 45.2 % (ref 39.0–52.0)
Hemoglobin: 15.3 g/dL (ref 13.0–17.0)
Immature Granulocytes: 0 %
Lymphocytes Relative: 30 %
Lymphs Abs: 2.6 10*3/uL (ref 0.7–4.0)
MCH: 29.8 pg (ref 26.0–34.0)
MCHC: 33.8 g/dL (ref 30.0–36.0)
MCV: 88.1 fL (ref 80.0–100.0)
Monocytes Absolute: 0.5 10*3/uL (ref 0.1–1.0)
Monocytes Relative: 6 %
Neutro Abs: 5.3 10*3/uL (ref 1.7–7.7)
Neutrophils Relative %: 62 %
Platelets: 269 10*3/uL (ref 150–400)
RBC: 5.13 MIL/uL (ref 4.22–5.81)
RDW: 15.2 % (ref 11.5–15.5)
WBC: 8.7 10*3/uL (ref 4.0–10.5)
nRBC: 0 % (ref 0.0–0.2)

## 2021-04-04 LAB — COMPREHENSIVE METABOLIC PANEL
ALT: 11 U/L (ref 0–44)
AST: 18 U/L (ref 15–41)
Albumin: 4.6 g/dL (ref 3.5–5.0)
Alkaline Phosphatase: 57 U/L (ref 38–126)
Anion gap: 11 (ref 5–15)
BUN: 8 mg/dL (ref 6–20)
CO2: 26 mmol/L (ref 22–32)
Calcium: 9 mg/dL (ref 8.9–10.3)
Chloride: 106 mmol/L (ref 98–111)
Creatinine, Ser: 0.78 mg/dL (ref 0.61–1.24)
GFR, Estimated: >60 mL/min (ref >60)
Glucose, Bld: 108 mg/dL — ABNORMAL HIGH (ref 70–99)
Potassium: 3.7 mmol/L (ref 3.5–5.1)
Sodium: 143 mmol/L (ref 135–145)
Total Bilirubin: 0.8 mg/dL (ref 0.3–1.2)
Total Protein: 8 g/dL (ref 6.5–8.1)

## 2021-04-04 LAB — LIPASE, BLOOD: Lipase: 29 U/L (ref 11–51)

## 2021-04-04 MED ORDER — LIDOCAINE-EPINEPHRINE 2 %-1:100000 IJ SOLN
20.0000 mL | Freq: Once | INTRAMUSCULAR | Status: DC
  Filled 2021-04-04: qty 1

## 2021-04-04 MED ORDER — CEPHALEXIN 500 MG PO CAPS: 500.0000 mg | ORAL_CAPSULE | Freq: Two times a day (BID) | ORAL | 0 refills | Status: DC

## 2021-04-04 MED ORDER — DROPERIDOL 2.5 MG/ML IJ SOLN
5.0000 mg | Freq: Once | INTRAMUSCULAR | Status: AC
  Administered 2021-04-04: 5 mg via INTRAMUSCULAR
  Filled 2021-04-04: qty 2

## 2021-04-04 MED ORDER — TETANUS-DIPHTH-ACELL PERTUSSIS 5-2.5-18.5 LF-MCG/0.5 IM SUSY
0.5000 mL | PREFILLED_SYRINGE | Freq: Once | INTRAMUSCULAR | Status: AC
  Administered 2021-04-04: 0.5 mL via INTRAMUSCULAR
  Filled 2021-04-04: qty 0.5

## 2021-04-04 MED ORDER — SODIUM CHLORIDE 0.9 % IV BOLUS
1000.0000 mL | Freq: Once | INTRAVENOUS | Status: AC
  Administered 2021-04-04: 1000 mL via INTRAVENOUS

## 2021-04-04 MED ORDER — CEPHALEXIN 500 MG PO CAPS: 500.0000 mg | ORAL_CAPSULE | Freq: Two times a day (BID) | ORAL | 0 refills | Status: AC

## 2021-04-04 NOTE — ED Notes (Signed)
Pt now VOL/ IVC paperwork has been rescinded
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
## 2021-04-04 NOTE — Discharge Instructions (Addendum)
Take the antibiotics to help prevent any infection.  The sutures will need to be checked on in 5 days to see if they are ready to be removed
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
## 2021-04-04 NOTE — ED Provider Notes (Signed)
Procedures     ----------------------------------------- 10:14 PM on 04/04/2021 -----------------------------------------  CT imaging all unremarkable, labs unremarkable.  Patient is now calm and cooperative.  He is alert and responsive to questions.  May have some residual mild intoxication but his family member at bedside is comfortable taking him home in his current condition and patient is eager to be discharged as well.  No SI HI or hallucinations, no evidence of delusions or psychosis.  He stable for discharge and I will rescind his IVC.    Sharman Cheek, MD 04/04/21 2214
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
## 2021-05-21 ENCOUNTER — Encounter: Payer: Self-pay | Admitting: Gerontology

## 2021-05-21 ENCOUNTER — Other Ambulatory Visit: Payer: Self-pay

## 2021-05-21 ENCOUNTER — Ambulatory Visit: Payer: Self-pay | Admitting: Gerontology

## 2021-05-21 VITALS — BP 146/82 | HR 98 | Temp 98.1°F | Ht 71.0 in | Wt 154.1 lb

## 2021-05-21 DIAGNOSIS — I10 Essential (primary) hypertension: Secondary | ICD-10-CM

## 2021-05-21 DIAGNOSIS — R519 Headache, unspecified: Secondary | ICD-10-CM | POA: Insufficient documentation

## 2021-05-21 DIAGNOSIS — K219 Gastro-esophageal reflux disease without esophagitis: Secondary | ICD-10-CM

## 2021-05-21 MED ORDER — AMLODIPINE BESYLATE 5 MG PO TABS
5.0000 mg | ORAL_TABLET | Freq: Every day | ORAL | 0 refills | Status: DC
  Filled 2021-05-21 – 2021-05-25 (×2): qty 30, 30d supply, fill #0

## 2021-05-21 MED ORDER — ACETAMINOPHEN 500 MG PO TABS
500.0000 mg | ORAL_TABLET | Freq: Four times a day (QID) | ORAL | 0 refills | Status: DC | PRN
  Filled 2021-05-21: qty 30, 8d supply, fill #0

## 2021-05-21 MED ORDER — PANTOPRAZOLE SODIUM 40 MG PO TBEC
40.0000 mg | DELAYED_RELEASE_TABLET | Freq: Every day | ORAL | 0 refills | Status: DC
  Filled 2021-05-21 – 2021-05-25 (×2): qty 30, 30d supply, fill #0

## 2021-05-21 NOTE — Patient Instructions (Signed)

## 2021-05-21 NOTE — Progress Notes (Signed)
Established Patient Office Visit  Subjective:  Patient ID: Andrew Long, male    DOB: 1980/12/04  Age: 40 y.o. MRN: [id number]  CC:  Chief Complaint  Patient presents with   Follow-up    Pt stated that "Omeprazole and blood pressure medicine doesn't work by middle of the day" , complains of headache, pressure is checked at residence-- runs around 130/80 in the evenings    HPI Andrew Long presents for routine follow up. He's currently at [facility] for rehabilitation. He states that he's compliant with his medications, but his blood pressure has being elevated. He c/o intermittent frontal headache  that started many weeks prior to his fall on 04/04/21, and his blood pressure has being going up too. He states that his intermittent headache increases from 6-10. He states that his blood pressure  increases with episodes of headache. He denies dizziness, vision changes and chest pain. He states that taking Tylenol relieves headache. He was seen at the ED on 04/04/21 for facial laceration due to fall which has completely healed. He also states that his acid reflux is not controlled with taking Omeprazole. Overall, he states that he's doing well and offers no further complaint.     Past Medical History:  Diagnosis Date   Acid reflux 09/06/2019   Allergy    Depression    Essential hypertension 12/13/2019   Patient denies medical problems 12/05/14   denies    Past Surgical History:  Procedure Laterality Date   denies     denies surgeries   LESION EXCISION N/A 12/25/2018   Procedure: EXCISION SCALP LESION RIGHT;  Surgeon: [name], MD;  Location: [facility] ORS;  Service: General;  Laterality: N/A;    Family History  Problem Relation Age of Onset   Heart failure Mother    Diabetes Mother    Hypertension Father    Other Maternal Grandmother        unknown medical history   Other Maternal Grandfather        unknown medical history   Other Paternal Grandmother        unknown medical  history   Other Paternal Grandfather        unknown medical history    Social History   Socioeconomic History   Marital status: Single    Spouse name: Not on file   Number of children: 2   Years of education: Not on file   Highest education level: Not on file  Occupational History   Not on file  Tobacco Use   Smoking status: Every Day    Packs/day: 0.15    Types: Cigarettes   Smokeless tobacco: Never   Tobacco comments:    3-4 cigarettes a day    Patient doesn't drink heavy anymore, but does have 3-4 beers per week  Vaping Use   Vaping Use: Never used  Substance and Sexual Activity   Alcohol use: Not Currently    Alcohol/week: 4.0 standard drinks    Types: 4 Cans of beer per week    Comment: quit 06/2020 pt states "a lot. whatever I can get.", drinks everyday   Drug use: Not Currently    Types: Cocaine    Comment: last use 2018 (tried cocaine)   Sexual activity: Yes    Birth control/protection: Condom  Other Topics Concern   Not on file  Social History Narrative   Not on file   Social Determinants of Health   Financial Resource Strain: Not on file  Food  Insecurity: Food Insecurity Present   Worried About Charity fundraiser in the Last Year: Sometimes true   Ran Out of Food in the Last Year: Sometimes true  Transportation Needs: No Transportation Needs   Lack of Transportation (Medical): No   Lack of Transportation (Non-Medical): No  Physical Activity: Not on file  Stress: Not on file  Social Connections: Not on file  Intimate Partner Violence: Not on file    Outpatient Medications Prior to Visit  Medication Sig Dispense Refill   Multiple Vitamin (MULTIVITAMIN WITH MINERALS) TABS tablet Take 1 tablet by mouth daily.     amLODipine (NORVASC) 2.5 MG tablet TAKE ONE TABLET BY MOUTH EVERY DAY 90 tablet 1   omeprazole (PRILOSEC) 20 MG capsule TAKE 1 CAPSULE BY MOUTH DAILY 30 capsule 3   gabapentin (NEURONTIN) 400 MG capsule TAKE ONE CAPSULE BY MOUTH 2 TIMES A  DAY (Patient not taking: Reported on 05/21/2021) 60 capsule 2   naltrexone (DEPADE) 50 MG tablet TAKE ONE TABLET (50MG TOTAL) BY MOUTH ONCE DAILY IN THE MORNING. (Patient not taking: No sig reported) 30 tablet 0   Oxcarbazepine (TRILEPTAL) 300 MG tablet TAKE 2 TABLETS BY MOUTH ONCE DAILY AT BEDTIME. (Patient not taking: Reported on 05/21/2021) 60 tablet 0   No facility-administered medications prior to visit.    Allergies  Allergen Reactions   Tramadol Nausea And Vomiting    ROS Review of Systems  Constitutional: Negative.   Respiratory: Negative.    Cardiovascular: Negative.   Gastrointestinal: Negative.   Neurological:  Positive for headaches.  Psychiatric/Behavioral: Negative.       Objective:    Physical Exam HENT:     Head: Normocephalic and atraumatic.     Mouth/Throat:     Mouth: Mucous membranes are moist.  Eyes:     Extraocular Movements: Extraocular movements intact.     Conjunctiva/sclera: Conjunctivae normal.     Pupils: Pupils are equal, round, and reactive to light.  Cardiovascular:     Rate and Rhythm: Normal rate and regular rhythm.     Pulses: Normal pulses.     Heart sounds: Normal heart sounds.  Pulmonary:     Effort: Pulmonary effort is normal.     Breath sounds: Normal breath sounds.  Neurological:     General: No focal deficit present.     Mental Status: He is alert and oriented to person, place, and time. Mental status is at baseline.  Psychiatric:        Mood and Affect: Mood normal.        Behavior: Behavior normal.        Thought Content: Thought content normal.        Judgment: Judgment normal.    BP (!) 146/82 (BP Location: Right Arm, Patient Position: Sitting, Cuff Size: Normal)   Pulse 98   Temp 98.1 F (36.7 C)   Ht 5' 11"  (1.803 m)   Wt 154 lb 1.6 oz (69.9 kg)   SpO2 98%   BMI 21.49 kg/m  Wt Readings from Last 3 Encounters:  05/21/21 154 lb 1.6 oz (69.9 kg)  04/04/21 190 lb (86.2 kg)  03/04/21 138 lb 9.6 oz (62.9 kg)      Health Maintenance Due  Topic Date Due   Pneumococcal Vaccine 49-18 Years old (1 - PCV) Never done   HIV Screening  Never done   Hepatitis C Screening  Never done   COVID-19 Vaccine (4 - Booster for Pfizer series) 08/22/2020    There are  no preventive care reminders to display for this patient.  Lab Results  Component Value Date   TSH 0.700 02/26/2021   Lab Results  Component Value Date   WBC 8.7 04/04/2021   HGB 15.3 04/04/2021   HCT 45.2 04/04/2021   MCV 88.1 04/04/2021   PLT 269 04/04/2021   Lab Results  Component Value Date   NA 143 04/04/2021   K 3.7 04/04/2021   CO2 26 04/04/2021   GLUCOSE 108 (H) 04/04/2021   BUN 8 04/04/2021   CREATININE 0.78 04/04/2021   BILITOT 0.8 04/04/2021   ALKPHOS 57 04/04/2021   AST 18 04/04/2021   ALT 11 04/04/2021   PROT 8.0 04/04/2021   ALBUMIN 4.6 04/04/2021   CALCIUM 9.0 04/04/2021   ANIONGAP 11 04/04/2021   EGFR 121 02/26/2021   Lab Results  Component Value Date   CHOL 126 12/24/2020   Lab Results  Component Value Date   HDL 47 12/24/2020   Lab Results  Component Value Date   LDLCALC 61 12/24/2020   Lab Results  Component Value Date   TRIG 94 12/24/2020   Lab Results  Component Value Date   CHOLHDL 2.7 12/24/2020   Lab Results  Component Value Date   HGBA1C 5.3 12/24/2020      Assessment & Plan:    1. Essential hypertension - His blood pressure  is not under control, his Amlodipine was increased to 5 mg, was advised to continue on DASH diet, check his blood pressure, record and bring log to follow up appointment. He was educated on signs and symptoms of Stroke, he was advised to go to the ED. - amLODipine (NORVASC) 5 MG tablet; Take 1 tablet (5 mg total) by mouth once daily.  Dispense: 30 tablet; Refill: 0  2. Gastroesophageal reflux disease without esophagitis -His acid reflux is not under control, he will start on Protonix, educated on medication side effects, and advised to notify clinic and go  to the ED. -Avoid spicy, fatty and fried food -Avoid sodas and sour juices -Avoid heavy meals -Avoid eating 4 hours before bedtime -Elevate head of bed at night - pantoprazole (PROTONIX) 40 MG tablet; Take 1 tablet (40 mg total) by mouth once daily.  Dispense: 30 tablet; Refill: 0 - He was encouraged to complete [facility] financial application for Ambulatory referral to Gastroenterology  3. Nonintractable headache, unspecified chronicity pattern, unspecified headache type - He was advised to continue on tylenol and go to the ED for worsening symptoms. - acetaminophen (TYLENOL) 500 MG tablet; Take 1 tablet (500 mg total) by mouth once every 6 (six) hours as needed.  Dispense: 30 tablet; Refill: 0    Follow-up: Return in about 4 weeks (around 06/18/2021), or if symptoms worsen or fail to improve.    Dymond Spreen Jerold Coombe, NP

## 2021-05-22 ENCOUNTER — Other Ambulatory Visit: Payer: Self-pay

## 2021-05-25 ENCOUNTER — Other Ambulatory Visit: Payer: Self-pay

## 2021-05-26 ENCOUNTER — Other Ambulatory Visit: Payer: Self-pay

## 2021-05-28 ENCOUNTER — Telehealth: Payer: Self-pay | Admitting: Pharmacy Technician

## 2021-05-28 NOTE — Telephone Encounter (Signed)
Patient now a resident at [facility].  Provided updated financial information.  Approved till time for re-certification in 2103, and as long as eligibility criteria continues to be met.  [facility]

## 2021-06-02 ENCOUNTER — Ambulatory Visit: Payer: Self-pay | Admitting: Gerontology

## 2021-06-16 ENCOUNTER — Other Ambulatory Visit: Payer: Self-pay

## 2021-06-16 ENCOUNTER — Other Ambulatory Visit: Payer: Self-pay | Admitting: Gerontology

## 2021-06-16 DIAGNOSIS — I10 Essential (primary) hypertension: Secondary | ICD-10-CM

## 2021-06-16 DIAGNOSIS — K219 Gastro-esophageal reflux disease without esophagitis: Secondary | ICD-10-CM

## 2021-06-16 MED ORDER — AMLODIPINE BESYLATE 5 MG PO TABS
5.0000 mg | ORAL_TABLET | Freq: Every day | ORAL | 0 refills | Status: DC
  Filled 2021-06-16 – 2021-06-18 (×2): qty 30, 30d supply, fill #0

## 2021-06-16 MED ORDER — PANTOPRAZOLE SODIUM 40 MG PO TBEC
40.0000 mg | DELAYED_RELEASE_TABLET | Freq: Every day | ORAL | 0 refills | Status: DC
  Filled 2021-06-16 – 2021-06-18 (×2): qty 30, 30d supply, fill #0

## 2021-06-18 ENCOUNTER — Other Ambulatory Visit: Payer: Self-pay

## 2021-06-18 ENCOUNTER — Encounter: Payer: Self-pay | Admitting: Gerontology

## 2021-06-18 ENCOUNTER — Ambulatory Visit: Payer: Self-pay | Admitting: Gerontology

## 2021-06-18 VITALS — BP 118/72 | HR 85 | Temp 98.0°F | Resp 16 | Ht 71.0 in | Wt 165.7 lb

## 2021-06-18 DIAGNOSIS — K219 Gastro-esophageal reflux disease without esophagitis: Secondary | ICD-10-CM

## 2021-06-18 DIAGNOSIS — R519 Headache, unspecified: Secondary | ICD-10-CM

## 2021-06-18 DIAGNOSIS — I10 Essential (primary) hypertension: Secondary | ICD-10-CM

## 2021-06-18 MED ORDER — AMLODIPINE BESYLATE 5 MG PO TABS
5.0000 mg | ORAL_TABLET | Freq: Every day | ORAL | 2 refills | Status: DC
  Filled 2021-06-18: qty 30, 30d supply, fill #0

## 2021-06-18 MED ORDER — PANTOPRAZOLE SODIUM 40 MG PO TBEC
40.0000 mg | DELAYED_RELEASE_TABLET | Freq: Every day | ORAL | 2 refills | Status: DC
  Filled 2021-06-18: qty 30, 30d supply, fill #0

## 2021-06-18 MED ORDER — ACETAMINOPHEN 500 MG PO TABS
1000.0000 mg | ORAL_TABLET | Freq: Two times a day (BID) | ORAL | 0 refills | Status: AC
  Filled 2021-06-18 – 2022-03-08 (×4): qty 60, 15d supply, fill #0

## 2021-06-18 NOTE — Patient Instructions (Signed)
Heartburn Heartburn is a type of pain or discomfort that can happen in your throat or chest. It is often described as a burning pain. It may also cause a bad, acid-like taste in your mouth. It may be caused by stomach contents that move back up (reflux) into the part of the body that moves food from your mouth to your stomach (esophagus). Heartburn may feel worse: When you lie down. When you bend over. At night. Follow these instructions at home: Eating and drinking  Avoid certain foods and drinks as told by your doctor. This may include: Coffee and tea, with or without caffeine. Drinks that have alcohol. Energy drinks and sports drinks. Carbonated drinks or sodas. Chocolate and cocoa. Peppermint and mint flavorings. Garlic and onions. Horseradish. Spicy and acidic foods, such as: Peppers. Chili powder and curry powder. Vinegar. Hot sauces and BBQ sauce. Citrus fruit juices and citrus fruits, such as: Oranges. Lemons. Limes. Tomato-based foods, such as: Red sauce and pizza with red sauce. Chili. Salsa. Fried and fatty foods, such as: Donuts. [place] fries and potato chips. High-fat dressings. High-fat meats, such as: Hot dogs and sausage. Rib eye steak. Ham and bacon. High-fat dairy items, such as: Whole milk. Butter. Cream cheese. Eat small meals often. Avoid eating large meals. Avoid drinking large amounts of liquid with your meals. Avoid eating meals during the 2-3 hours before bedtime. Avoid lying down right after you eat. Do not exercise right after you eat. Lifestyle   If you are overweight, lose an amount of weight that is healthy for you. Ask your doctor about a safe weight loss goal. Do not smoke or use any products that contain nicotine or tobacco. These can make your symptoms worse. If you need help quitting, ask your doctor. Wear loose clothes. Do not wear anything tight around your waist. Raise (elevate) the head of your bed about 6 inches (15 cm) when  you sleep. You can use a wedge to do this. Try to lower your stress. If you need help doing this, ask your doctor. Medicines Take over-the-counter and prescription medicines only as told by your doctor. Do not take aspirin or NSAIDs, such as ibuprofen, unless your doctor says it is okay. Stop medicines only as told by your doctor. If you stop taking some medicines too quickly, your symptoms may get worse. General instructions Watch for any changes in your symptoms. Keep all follow-up visits. Contact a doctor if: You have new symptoms. You lose weight and you do not know why. You have trouble swallowing, or it hurts to swallow. You have wheezing or a cough that keeps happening. Your symptoms do not get better with treatment. You have heartburn often for more than 2 weeks. Get help right away if: You have pain in your arms, neck, jaw, teeth, or back all of a sudden. You feel sweaty, dizzy, or light-headed all of a sudden. You have chest pain or shortness of breath. You vomit and your vomit looks like blood or coffee grounds. Your poop (stool) is bloody or black. These symptoms may be an emergency. Get help right away. Call your local emergency services (911 in the [place] Do not wait to see if the symptoms will go away. Do not drive yourself to the hospital. Summary Heartburn is a type of pain that can happen in your throat or chest. It can feel like a burning pain. It may also cause a bad, acid-like taste in your mouth. You may need to avoid certain foods and  drinks to help your symptoms. Ask your doctor what foods and drinks you should avoid. Take over-the-counter and prescription medicines only as told by your doctor. Do not take aspirin or NSAIDs, such as ibuprofen, unless your doctor told you to do so. Contact your doctor if your symptoms do not get better or they get worse. This information is not intended to replace advice given to you by your health care provider. Make sure you  discuss any questions you have with your health care provider. Document Revised: 01/02/2020 Document Reviewed: 01/02/2020 Elsevier Patient Education  2022 Elsevier Inc. DASH Eating Plan DASH stands for Dietary Approaches to Stop Hypertension. The DASH eating plan is a healthy eating plan that has been shown to: Reduce high blood pressure (hypertension). Reduce your risk for type 2 diabetes, heart disease, and stroke. Help with weight loss. What are tips for following this plan? Reading food labels Check food labels for the amount of salt (sodium) per serving. Choose foods with less than 5 percent of the Daily Value of sodium. Generally, foods with less than 300 milligrams (mg) of sodium per serving fit into this eating plan. To find whole grains, look for the word "whole" as the first word in the ingredient list. Shopping Buy products labeled as "low-sodium" or "no salt added." Buy fresh foods. Avoid canned foods and pre-made or frozen meals. Cooking Avoid adding salt when cooking. Use salt-free seasonings or herbs instead of table salt or sea salt. Check with your health care provider or pharmacist before using salt substitutes. Do not fry foods. Cook foods using healthy methods such as baking, boiling, grilling, roasting, and broiling instead. Cook with heart-healthy oils, such as olive, canola, avocado, soybean, or sunflower oil. Meal planning  Eat a balanced diet that includes: 4 or more servings of fruits and 4 or more servings of vegetables each day. Try to fill one-half of your plate with fruits and vegetables. 6-8 servings of whole grains each day. Less than 6 oz (170 g) of lean meat, poultry, or fish each day. A 3-oz (85-g) serving of meat is about the same size as a deck of cards. One egg equals 1 oz (28 g). 2-3 servings of low-fat dairy each day. One serving is 1 cup (237 mL). 1 serving of nuts, seeds, or beans 5 times each week. 2-3 servings of heart-healthy fats. Healthy fats  called omega-3 fatty acids are found in foods such as walnuts, flaxseeds, fortified milks, and eggs. These fats are also found in cold-water fish, such as sardines, salmon, and mackerel. Limit how much you eat of: Canned or prepackaged foods. Food that is high in trans fat, such as some fried foods. Food that is high in saturated fat, such as fatty meat. Desserts and other sweets, sugary drinks, and other foods with added sugar. Full-fat dairy products. Do not salt foods before eating. Do not eat more than 4 egg yolks a week. Try to eat at least 2 vegetarian meals a week. Eat more home-cooked food and less restaurant, buffet, and fast food. Lifestyle When eating at a restaurant, ask that your food be prepared with less salt or no salt, if possible. If you drink alcohol: Limit how much you use to: 0-1 drink a day for women who are not pregnant. 0-2 drinks a day for men. Be aware of how much alcohol is in your drink. In the [place] one drink equals one 12 oz bottle of beer (355 mL), one 5 oz glass of wine (148 mL),  or one 1 oz glass of hard liquor (44 mL). General information Avoid eating more than 2,300 mg of salt a day. If you have hypertension, you may need to reduce your sodium intake to 1,500 mg a day. Work with your health care provider to maintain a healthy body weight or to lose weight. Ask what an ideal weight is for you. Get at least 30 minutes of exercise that causes your heart to beat faster (aerobic exercise) most days of the week. Activities may include walking, swimming, or biking. Work with your health care provider or dietitian to adjust your eating plan to your individual calorie needs. What foods should I eat? Fruits All fresh, dried, or frozen fruit. Canned fruit in natural juice (without added sugar). Vegetables Fresh or frozen vegetables (raw, steamed, roasted, or grilled). Low-sodium or reduced-sodium tomato and vegetable juice. Low-sodium or reduced-sodium tomato sauce  and tomato paste. Low-sodium or reduced-sodium canned vegetables. Grains Whole-grain or whole-wheat bread. Whole-grain or whole-wheat pasta. Brown rice. Orpah Cobb. Bulgur. Whole-grain and low-sodium cereals. Pita bread. Low-fat, low-sodium crackers. Whole-wheat flour tortillas. Meats and other proteins Skinless chicken or [place]. Ground chicken or [place]. Pork with fat trimmed off. Fish and seafood. Egg whites. Dried beans, peas, or lentils. Unsalted nuts, nut butters, and seeds. Unsalted canned beans. Lean cuts of beef with fat trimmed off. Low-sodium, lean precooked or cured meat, such as sausages or meat loaves. Dairy Low-fat (1%) or fat-free (skim) milk. Reduced-fat, low-fat, or fat-free cheeses. Nonfat, low-sodium ricotta or cottage cheese. Low-fat or nonfat yogurt. Low-fat, low-sodium cheese. Fats and oils Soft margarine without trans fats. Vegetable oil. Reduced-fat, low-fat, or light mayonnaise and salad dressings (reduced-sodium). Canola, safflower, olive, avocado, soybean, and sunflower oils. Avocado. Seasonings and condiments Herbs. Spices. Seasoning mixes without salt. Other foods Unsalted popcorn and pretzels. Fat-free sweets. The items listed above may not be a complete list of foods and beverages you can eat. Contact a dietitian for more information. What foods should I avoid? Fruits Canned fruit in a light or heavy syrup. Fried fruit. Fruit in cream or butter sauce. Vegetables Creamed or fried vegetables. Vegetables in a cheese sauce. Regular canned vegetables (not low-sodium or reduced-sodium). Regular canned tomato sauce and paste (not low-sodium or reduced-sodium). Regular tomato and vegetable juice (not low-sodium or reduced-sodium). Rosita Fire. Olives. Grains Baked goods made with fat, such as croissants, muffins, or some breads. Dry pasta or rice meal packs. Meats and other proteins Fatty cuts of meat. Ribs. Fried meat. Tomasa Blase. Bologna, salami, and other precooked or  cured meats, such as sausages or meat loaves. Fat from the back of a pig (fatback). Bratwurst. Salted nuts and seeds. Canned beans with added salt. Canned or smoked fish. Whole eggs or egg yolks. Chicken or [place] with skin. Dairy Whole or 2% milk, cream, and half-and-half. Whole or full-fat cream cheese. Whole-fat or sweetened yogurt. Full-fat cheese. Nondairy creamers. Whipped toppings. Processed cheese and cheese spreads. Fats and oils Butter. Stick margarine. Lard. Shortening. Ghee. Bacon fat. Tropical oils, such as coconut, palm kernel, or palm oil. Seasonings and condiments Onion salt, garlic salt, seasoned salt, table salt, and sea salt. Worcestershire sauce. Tartar sauce. Barbecue sauce. Teriyaki sauce. Soy sauce, including reduced-sodium. Steak sauce. Canned and packaged gravies. Fish sauce. Oyster sauce. Cocktail sauce. Store-bought horseradish. Ketchup. Mustard. Meat flavorings and tenderizers. Bouillon cubes. Hot sauces. Pre-made or packaged marinades. Pre-made or packaged taco seasonings. Relishes. Regular salad dressings. Other foods Salted popcorn and pretzels. The items listed above may not be a complete  list of foods and beverages you should avoid. Contact a dietitian for more information. Where to find more information National Heart, Lung, and Blood Institute: PopSteam.is American Heart Association: www.heart.org Academy of Nutrition and Dietetics: www.eatright.org National Kidney Foundation: www.kidney.org Summary The DASH eating plan is a healthy eating plan that has been shown to reduce high blood pressure (hypertension). It may also reduce your risk for type 2 diabetes, heart disease, and stroke. When on the DASH eating plan, aim to eat more fresh fruits and vegetables, whole grains, lean proteins, low-fat dairy, and heart-healthy fats. With the DASH eating plan, you should limit salt (sodium) intake to 2,300 mg a day. If you have hypertension, you may need to reduce  your sodium intake to 1,500 mg a day. Work with your health care provider or dietitian to adjust your eating plan to your individual calorie needs. This information is not intended to replace advice given to you by your health care provider. Make sure you discuss any questions you have with your health care provider. Document Revised: 06/01/2019 Document Reviewed: 06/01/2019 Elsevier Patient Education  2022 [employer].

## 2021-06-18 NOTE — Progress Notes (Signed)
Established Patient Office Visit  Subjective:  Patient ID: Andrew Long, male    DOB: 12/20/1980  Age: 40 y.o. MRN: [id number]  CC:  Chief Complaint  Patient presents with   Follow-up   Hypertension    Patient states his BP has been running around 117/72.    Gastroesophageal Reflux    Patient states reflux has improved, but not completely resolved.    HPI Andrew Long is a 40 year old male who has history of acid reflux, allergy, depression, hypertension, presents for follow-up visit and medication refills.  He's currently at [facility] for rehabilitation. He states that he's compliant with his medications, denies side effects and continues to make healthy lifestyle changes.  His blood pressure is improving though he experiences occasional headaches which is resolved with taking tylenol. He states that his acid reflux has improved 30% with taking Protonix.  Overall, he states that he is doing well and offers no further complaint.  Past Medical History:  Diagnosis Date   Acid reflux 09/06/2019   Allergy    Depression    Essential hypertension 12/13/2019   Patient denies medical problems 12/05/14   denies    Past Surgical History:  Procedure Laterality Date   denies     denies surgeries   LESION EXCISION N/A 12/25/2018   Procedure: EXCISION SCALP LESION RIGHT;  Surgeon: [name], MD;  Location: [facility] ORS;  Service: General;  Laterality: N/A;    Family History  Problem Relation Age of Onset   Heart failure Mother    Diabetes Mother    Hypertension Father    Other Maternal Grandmother        unknown medical history   Other Maternal Grandfather        unknown medical history   Other Paternal Grandmother        unknown medical history   Other Paternal Grandfather        unknown medical history    Social History   Socioeconomic History   Marital status: Single    Spouse name: Not on file   Number of children: 2   Years of education: Not on file   Highest education  level: Not on file  Occupational History   Not on file  Tobacco Use   Smoking status: Every Day    Packs/day: 0.15    Types: Cigarettes   Smokeless tobacco: Never   Tobacco comments:    3-4 cigarettes a day    Patient doesn't drink heavy anymore  Vaping Use   Vaping Use: Never used  Substance and Sexual Activity   Alcohol use: Not Currently    Alcohol/week: 4.0 standard drinks    Types: 4 Cans of beer per week    Comment: quit 06/2020 pt states "a lot. whatever I can get.", drinks everyday   Drug use: Not Currently    Types: Cocaine    Comment: last use 2018 (tried cocaine)   Sexual activity: Yes    Birth control/protection: Condom  Other Topics Concern   Not on file  Social History Narrative   Not on file   Social Determinants of Health   Financial Resource Strain: Not on file  Food Insecurity: Food Insecurity Present   Worried About [place] in the Last Year: Sometimes true   Ran Out of Food in the Last Year: Sometimes true  Transportation Needs: No Transportation Needs   Lack of Transportation (Medical): No   Lack of Transportation (Non-Medical): No  Physical Activity:  Not on file  Stress: Not on file  Social Connections: Not on file  Intimate Partner Violence: Not on file    Outpatient Medications Prior to Visit  Medication Sig Dispense Refill   amLODipine (NORVASC) 5 MG tablet Take 1 tablet (5 mg total) by mouth once daily. 30 tablet 0   Multiple Vitamin (MULTIVITAMIN WITH MINERALS) TABS tablet Take 1 tablet by mouth daily.     pantoprazole (PROTONIX) 40 MG tablet Take 1 tablet (40 mg total) by mouth once daily. 30 tablet 0   acetaminophen (TYLENOL) 500 MG tablet Take 1 tablet (500 mg total) by mouth once every 6 (six) hours as needed. 30 tablet 0   No facility-administered medications prior to visit.    Allergies  Allergen Reactions   Tramadol Nausea And Vomiting    ROS Review of Systems  Constitutional: Negative.   Eyes: Negative.    Respiratory: Negative.    Cardiovascular: Negative.   Gastrointestinal: Negative.   Neurological:  Positive for headaches.  Psychiatric/Behavioral: Negative.       Objective:    Physical Exam HENT:     Head: Normocephalic and atraumatic.  Eyes:     Extraocular Movements: Extraocular movements intact.     Conjunctiva/sclera: Conjunctivae normal.     Pupils: Pupils are equal, round, and reactive to light.  Cardiovascular:     Rate and Rhythm: Normal rate and regular rhythm.     Pulses: Normal pulses.     Heart sounds: Normal heart sounds.  Pulmonary:     Effort: Pulmonary effort is normal.     Breath sounds: Normal breath sounds.  Abdominal:     General: Bowel sounds are normal.     Palpations: Abdomen is soft.     Tenderness: There is no abdominal tenderness. There is no guarding.  Neurological:     General: No focal deficit present.     Mental Status: He is alert and oriented to person, place, and time. Mental status is at baseline.  Psychiatric:        Mood and Affect: Mood normal.        Behavior: Behavior normal.        Thought Content: Thought content normal.        Judgment: Judgment normal.    BP 118/72 (BP Location: Right Arm, Patient Position: Sitting, Cuff Size: Large)   Pulse 85   Temp 98 F (36.7 C) (Oral)   Resp 16   Ht _0  (1.803 m)   Wt 165 lb 11.2 oz (75.2 kg)   SpO2 97%   BMI 23.11 kg/m  Wt Readings from Last 3 Encounters:  06/18/21 165 lb 11.2 oz (75.2 kg)  05/21/21 154 lb 1.6 oz (69.9 kg)  04/04/21 190 lb (86.2 kg)     Health Maintenance Due  Topic Date Due   Pneumococcal Vaccine 57-55 Years old (1 - PCV) Never done   HIV Screening  Never done   Hepatitis C Screening  Never done   COVID-19 Vaccine (4 - Booster for Pfizer series) 08/22/2020    There are no preventive care reminders to display for this patient.  Lab Results  Component Value Date   TSH 0.700 02/26/2021   Lab Results  Component Value Date   WBC 8.7 04/04/2021    HGB 15.3 04/04/2021   HCT 45.2 04/04/2021   MCV 88.1 04/04/2021   PLT 269 04/04/2021   Lab Results  Component Value Date   NA 143 04/04/2021   K 3.7 04/04/2021  CO2 26 04/04/2021   GLUCOSE 108 (H) 04/04/2021   BUN 8 04/04/2021   CREATININE 0.78 04/04/2021   BILITOT 0.8 04/04/2021   ALKPHOS 57 04/04/2021   AST 18 04/04/2021   ALT 11 04/04/2021   PROT 8.0 04/04/2021   ALBUMIN 4.6 04/04/2021   CALCIUM 9.0 04/04/2021   ANIONGAP 11 04/04/2021   EGFR 121 02/26/2021   Lab Results  Component Value Date   CHOL 126 12/24/2020   Lab Results  Component Value Date   HDL 47 12/24/2020   Lab Results  Component Value Date   LDLCALC 61 12/24/2020   Lab Results  Component Value Date   TRIG 94 12/24/2020   Lab Results  Component Value Date   CHOLHDL 2.7 12/24/2020   Lab Results  Component Value Date   HGBA1C 5.3 12/24/2020      Assessment & Plan:    1. Essential hypertension -His blood pressure is improving and he will continue on current medication, and DASH diet. - amLODipine (NORVASC) 5 MG tablet; Take 1 tablet (5 mg total) by mouth once daily.  Dispense: 30 tablet; Refill: 2  2. Gastroesophageal reflux disease without esophagitis -His acid reflux is improving and he will continue on current medication.  He was advised to complete the [facility] financial application for possible gastroenterology referral. -Avoid spicy, fatty and fried food -Avoid sodas and sour juices -Avoid heavy meals -Avoid eating 4 hours before bedtime -Elevate head of bed at night - pantoprazole (PROTONIX) 40 MG tablet; Take 1 tablet (40 mg total) by mouth once daily.  Dispense: 30 tablet; Refill: 2  3. Nonintractable headache, unspecified chronicity pattern, unspecified headache type -His headache is improving, he will continue on 1000 mg Tylenol every 12 hours as needed.  He was advised to notify clinic or go to the emergency room for worsening symptoms. - acetaminophen (TYLENOL) 500 MG tablet;  Take 2 tablets (1,000 mg total) by mouth 2 (two) times daily.  Dispense: 60 tablet; Refill: 0     Follow-up: Return in about 1 month (around 07/21/2021), or if symptoms worsen or fail to improve.    Doctor Sheahan Jerold Coombe, NP

## 2021-06-30 ENCOUNTER — Other Ambulatory Visit: Payer: Self-pay

## 2021-07-23 ENCOUNTER — Other Ambulatory Visit: Payer: Self-pay

## 2021-07-23 ENCOUNTER — Ambulatory Visit: Payer: Self-pay | Admitting: Gerontology

## 2021-07-23 ENCOUNTER — Encounter: Payer: Self-pay | Admitting: Gerontology

## 2021-07-23 VITALS — BP 119/75 | HR 87 | Temp 98.1°F | Resp 16 | Ht 71.0 in | Wt 172.8 lb

## 2021-07-23 DIAGNOSIS — R1084 Generalized abdominal pain: Secondary | ICD-10-CM

## 2021-07-23 DIAGNOSIS — I10 Essential (primary) hypertension: Secondary | ICD-10-CM

## 2021-07-23 DIAGNOSIS — R109 Unspecified abdominal pain: Secondary | ICD-10-CM | POA: Insufficient documentation

## 2021-07-23 DIAGNOSIS — K219 Gastro-esophageal reflux disease without esophagitis: Secondary | ICD-10-CM

## 2021-07-23 MED ORDER — AMLODIPINE BESYLATE 5 MG PO TABS
5.0000 mg | ORAL_TABLET | Freq: Every day | ORAL | 2 refills | Status: DC
  Filled 2021-07-23: qty 30, 30d supply, fill #0
  Filled 2021-08-17: qty 30, 30d supply, fill #1

## 2021-07-23 MED ORDER — FAMOTIDINE 20 MG PO TABS
20.0000 mg | ORAL_TABLET | Freq: Every day | ORAL | 0 refills | Status: DC
  Filled 2021-07-23: qty 30, 30d supply, fill #0

## 2021-07-23 MED ORDER — PANTOPRAZOLE SODIUM 40 MG PO TBEC
40.0000 mg | DELAYED_RELEASE_TABLET | Freq: Every day | ORAL | 2 refills | Status: DC
  Filled 2021-07-23: qty 30, 30d supply, fill #0
  Filled 2021-08-17: qty 30, 30d supply, fill #1

## 2021-07-23 NOTE — Patient Instructions (Signed)
Food Choices for Gastroesophageal Reflux Disease, Adult When you have gastroesophageal reflux disease (GERD), the foods you eat and your eating habits are very important. Choosing the right foods can help ease your discomfort. Think about working with a food expert (dietitian) to help you make good choices. What are tips for following this plan? Reading food labels Look for foods that are low in saturated fat. Foods that may help with your symptoms include: Foods that have less than 5% of daily value (DV) of fat. Foods that have 0 grams of trans fat. Cooking Do not fry your food. Cook your food by baking, steaming, grilling, or broiling. These are all methods that do not need a lot of fat for cooking. To add flavor, try to use herbs that are low in spice and acidity. Meal planning  Choose healthy foods that are low in fat, such as: Fruits and vegetables. Whole grains. Low-fat dairy products. Lean meats, fish, and poultry. Eat small meals often instead of eating 3 large meals each day. Eat your meals slowly in a place where you are relaxed. Avoid bending over or lying down until 2-3 hours after eating. Limit high-fat foods such as fatty meats or fried foods. Limit your intake of fatty foods, such as oils, butter, and shortening. Avoid the following as told by your doctor: Foods that cause symptoms. These may be different for different people. Keep a food diary to keep track of foods that cause symptoms. Alcohol. Drinking a lot of liquid with meals. Eating meals during the 2-3 hours before bed. Lifestyle Stay at a healthy weight. Ask your doctor what weight is healthy for you. If you need to lose weight, work with your doctor to do so safely. Exercise for at least 30 minutes on 5 or more days each week, or as told by your doctor. Wear loose-fitting clothes. Do not smoke or use any products that contain nicotine or tobacco. If you need help quitting, ask your doctor. Sleep with the head  of your bed higher than your feet. Use a wedge under the mattress or blocks under the bed frame to raise the head of the bed. Chew sugar-free gum after meals. What foods should eat? Eat a healthy, well-balanced diet of fruits, vegetables, whole grains, low-fat dairy products, lean meats, fish, and poultry. Each person is different. Foods that may cause symptoms in one person may not cause any symptoms in another person. Work with your doctor to find foods that are safe for you. The items listed above may not be a complete list of what you can eat and drink. Contact a food expert for more options. What foods should I avoid? Limiting some of these foods may help in managing the symptoms of GERD. Everyone is different. Talk with a food expert or your doctor to help you find the exact foods to avoid, if any. Fruits Any fruits prepared with added fat. Any fruits that cause symptoms. For some people, this may include citrus fruits, such as oranges, grapefruit, pineapple, and lemons. Vegetables Deep-fried vegetables. French fries. Any vegetables prepared with added fat. Any vegetables that cause symptoms. For some people, this may include tomatoes and tomato products, chili peppers, onions and garlic, and horseradish. Grains Pastries or quick breads with added fat. Meats and other proteins High-fat meats, such as fatty beef or pork, hot dogs, ribs, ham, sausage, salami, and bacon. Fried meat or protein, including fried fish and fried chicken. Nuts and nut butters, in large amounts. Dairy Whole milk   and chocolate milk. Sour cream. Cream. Ice cream. Cream cheese. Milkshakes. °Fats and oils °Butter. Margarine. Shortening. Ghee. °Beverages °Coffee and tea, with or without caffeine. Carbonated beverages. Sodas. Energy drinks. Fruit juice made with acidic fruits, such as orange or grapefruit. Tomato juice. Alcoholic drinks. °Sweets and desserts °Chocolate and cocoa. Donuts. °Seasonings and condiments °Pepper.  Peppermint and spearmint. Added salt. Any condiments, herbs, or seasonings that cause symptoms. For some people, this may include curry, hot sauce, or vinegar-based salad dressings. °The items listed above may not be a complete list of what you should not eat and drink. Contact a food expert for more options. °Questions to ask your doctor °Diet and lifestyle changes are often the first steps that are taken to manage symptoms of GERD. If diet and lifestyle changes do not help, talk with your doctor about taking medicines. °Where to find more information °International Foundation for Gastrointestinal Disorders: aboutgerd.org °Summary °When you have GERD, food and lifestyle choices are very important in easing your symptoms. °Eat small meals often instead of 3 large meals a day. Eat your meals slowly and in a place where you are relaxed. °Avoid bending over or lying down until 2-3 hours after eating. °Limit high-fat foods such as fatty meats or fried foods. °This information is not intended to replace advice given to you by your health care provider. Make sure you discuss any questions you have with your health care provider. °Document Revised: 01/07/2020 Document Reviewed: 01/07/2020 °Elsevier Patient Education © 2022 Else[employer] Inc[employer] °DASH Eating Plan °DASH stands for Dietary Approaches to Stop Hypertension. The DASH eating plan is a healthy eating plan that has been shown to: °Reduce high blood pressure (hypertension). °Reduce your risk for type 2 diabetes, heart disease, and stroke. °Help with weight loss. °What are tips for following this plan? °Reading food labels °Check food labels for the amount of salt (sodium) per serving. Choose foods with less than 5 percent of the Daily Value of sodium. Generally, foods with less than 300 milligrams (mg) of sodium per serving fit into this eating plan. °To find whole grains, look for the word "whole" as the first word in the ingredient list. °Shopping °Buy products labeled as  "low-sodium" or "no salt added." °Buy fresh foods. Avoid canned foods and pre-made or frozen meals. °Cooking °Avoid adding salt when cooking. Use salt-free seasonings or herbs instead of table salt or sea salt. Check with your health care provider or pharmacist before using salt substitutes. °Do not fry foods. Cook foods using healthy methods such as baking, boiling, grilling, roasting, and broiling instead. °Cook with heart-healthy oils, such as olive, canola, avocado, soybean, or sunflower oil. °Meal planning ° °Eat a balanced diet that includes: °4 or more servings of fruits and 4 or more servings of vegetables each day. Try to fill one-half of your plate with fruits and vegetables. °6-8 servings of whole grains each day. °Less than 6 oz (170 g) of lean meat, poultry, or fish each day. A 3-oz (85-g) serving of meat is about the same size as a deck of cards. One egg equals 1 oz (28 g). °2-3 servings of low-fat dairy each day. One serving is 1 cup (237 mL). °1 serving of nuts, seeds, or beans 5 times each week. °2-3 servings of heart-healthy fats. Healthy fats called omega-3 fatty acids are found in foods such as walnuts, flaxseeds, fortified milks, and eggs. These fats are also found in cold-water fish, such as sardines, salmon, and mackerel. °Limit how much you   eat of: °Canned or prepackaged foods. °Food that is high in trans fat, such as some fried foods. °Food that is high in saturated fat, such as fatty meat. °Desserts and other sweets, sugary drinks, and other foods with added sugar. °Full-fat dairy products. °Do not salt foods before eating. °Do not eat more than 4 egg yolks a week. °Try to eat at least 2 vegetarian meals a week. °Eat more home-cooked food and less restaurant, buffet, and fast food. °Lifestyle °When eating at a restaurant, ask that your food be prepared with less salt or no salt, if possible. °If you drink alcohol: °Limit how much you use to: °0-1 drink a day for women who are not  pregnant. °0-2 drinks a day for men. °Be aware of how much alcohol is in your drink. In the [place] one drink equals one 12 oz bottle of beer (355 mL), one 5 oz glass of wine (148 mL), or one 1½ oz glass of hard liquor (44 mL). °General information °Avoid eating more than 2,300 mg of salt a day. If you have hypertension, you may need to reduce your sodium intake to 1,500 mg a day. °Work with your health care provider to maintain a healthy body weight or to lose weight. Ask what an ideal weight is for you. °Get at least 30 minutes of exercise that causes your heart to beat faster (aerobic exercise) most days of the week. Activities may include walking, swimming, or biking. °Work with your health care provider or dietitian to adjust your eating plan to your individual calorie needs. °What foods should I eat? °Fruits °All fresh, dried, or frozen fruit. Canned fruit in natural juice (without added sugar). °Vegetables °Fresh or frozen vegetables (raw, steamed, roasted, or grilled). Low-sodium or reduced-sodium tomato and vegetable juice. Low-sodium or reduced-sodium tomato sauce and tomato paste. Low-sodium or reduced-sodium canned vegetables. °Grains °Whole-grain or whole-wheat bread. Whole-grain or whole-wheat pasta. Brown rice. Oatmeal. Quinoa. Bulgur. Whole-grain and low-sodium cereals. Pita bread. Low-fat, low-sodium crackers. Whole-wheat flour tortillas. °Meats and other proteins °Skinless chicken or turkey. Ground chicken or turkey. Pork with fat trimmed off. Fish and seafood. Egg whites. Dried beans, peas, or lentils. Unsalted nuts, nut butters, and seeds. Unsalted canned beans. Lean cuts of beef with fat trimmed off. Low-sodium, lean precooked or cured meat, such as sausages or meat loaves. °Dairy °Low-fat (1%) or fat-free (skim) milk. Reduced-fat, low-fat, or fat-free cheeses. Nonfat, low-sodium ricotta or cottage cheese. Low-fat or nonfat yogurt. Low-fat, low-sodium cheese. °Fats and oils °Soft margarine without  trans fats. Vegetable oil. Reduced-fat, low-fat, or light mayonnaise and salad dressings (reduced-sodium). Canola, safflower, olive, avocado, soybean, and sunflower oils. Avocado. °Seasonings and condiments °Herbs. Spices. Seasoning mixes without salt. °Other foods °Unsalted popcorn and pretzels. Fat-free sweets. °The items listed above may not be a complete list of foods and beverages you can eat. Contact a dietitian for more information. °What foods should I avoid? °Fruits °Canned fruit in a light or heavy syrup. Fried fruit. Fruit in cream or butter sauce. °Vegetables °Creamed or fried vegetables. Vegetables in a cheese sauce. Regular canned vegetables (not low-sodium or reduced-sodium). Regular canned tomato sauce and paste (not low-sodium or reduced-sodium). Regular tomato and vegetable juice (not low-sodium or reduced-sodium). Pickles. Olives. °Grains °Baked goods made with fat, such as croissants, muffins, or some breads. Dry pasta or rice meal packs. °Meats and other proteins °Fatty cuts of meat. Ribs. Fried meat. Bacon. Bologna, salami, and other precooked or cured meats, such as sausages or meat loaves.   Fat from the back of a pig (fatback). Bratwurst. Salted nuts and seeds. Canned beans with added salt. Canned or smoked fish. Whole eggs or egg yolks. Chicken or turkey with skin. °Dairy °Whole or 2% milk, cream, and half-and-half. Whole or full-fat cream cheese. Whole-fat or sweetened yogurt. Full-fat cheese. Nondairy creamers. Whipped toppings. Processed cheese and cheese spreads. °Fats and oils °Butter. Stick margarine. Lard. Shortening. Ghee. Bacon fat. Tropical oils, such as coconut, palm kernel, or palm oil. °Seasonings and condiments °Onion salt, garlic salt, seasoned salt, table salt, and sea salt. Worcestershire sauce. Tartar sauce. Barbecue sauce. Teriyaki sauce. Soy sauce, including reduced-sodium. Steak sauce. Canned and packaged gravies. Fish sauce. Oyster sauce. Cocktail sauce. Store-bought  horseradish. Ketchup. Mustard. Meat flavorings and tenderizers. Bouillon cubes. Hot sauces. Pre-made or packaged marinades. Pre-made or packaged taco seasonings. Relishes. Regular salad dressings. °Other foods °Salted popcorn and pretzels. °The items listed above may not be a complete list of foods and beverages you should avoid. Contact a dietitian for more information. °Where to find more information °National Heart, Lung, and Blood Institute: www.nhlbi.nih.gov °American Heart Association: www.heart.org °Academy of Nutrition and Dietetics: www.eatright.org °National Kidney Foundation: www.kidney.org °Summary °The DASH eating plan is a healthy eating plan that has been shown to reduce high blood pressure (hypertension). It may also reduce your risk for type 2 diabetes, heart disease, and stroke. °When on the DASH eating plan, aim to eat more fresh fruits and vegetables, whole grains, lean proteins, low-fat dairy, and heart-healthy fats. °With the DASH eating plan, you should limit salt (sodium) intake to 2,300 mg a day. If you have hypertension, you may need to reduce your sodium intake to 1,500 mg a day. °Work with your health care provider or dietitian to adjust your eating plan to your individual calorie needs. °This information is not intended to replace advice given to you by your health care provider. Make sure you discuss any questions you have with your health care provider. °Document Revised: 06/01/2019 Document Reviewed: 06/01/2019 °Elsevier Patient Education © 2022 [employer]vier Inc. ° °

## 2021-07-23 NOTE — Progress Notes (Signed)
Established Patient Office Visit  Subjective:  Patient ID: Andrew Long, male    DOB: Oct 21, 1980  Age: 41 y.o. MRN: [id number]  CC:  Chief Complaint  Patient presents with   Follow-up   Hypertension    Patient states blood pressure is doing good. This morning BP 122/73.   Gastroesophageal Reflux    Patient states he is still having reflux even with the Protonix and pain in his abdomen in the mornings.    HPI Andrew Long  is a 41 year old male who has history of acid reflux, allergy, depression, hypertension, presents for follow-up visit. He states that his blood pressure is under control with taking 5 mg of Amlodipine. He continues to reside at [facility] for rehabilitation. He states that his acid reflux is not under control especially in the afternnon prior to lunch time and  8/10 sharp non radiating abdominal pain in the morning. He states that abdominal pain is chronic, been going on for more than 3 years but the frequency has increased for the past 2 months. He states that pain resolves in 2-3 hours. He denies nausea or bloating with abdominal pain. He denies aggravating nor relieving factor. Overall, he states that he's doing well and offers no further complaint.    Past Medical History:  Diagnosis Date   Acid reflux 09/06/2019   Allergy    Depression    Essential hypertension 12/13/2019   Patient denies medical problems 12/05/2014   denies   Substance abuse ([place])    Alcohol    Past Surgical History:  Procedure Laterality Date   denies     denies surgeries   LESION EXCISION N/A 12/25/2018   Procedure: EXCISION SCALP LESION RIGHT;  Surgeon: [name], MD;  Location: [facility] ORS;  Service: General;  Laterality: N/A;    Family History  Problem Relation Age of Onset   Heart failure Mother    Diabetes Mother    Hypertension Father    Other Maternal Grandmother        unknown medical history   Other Maternal Grandfather        unknown medical history   Other  Paternal Grandmother        unknown medical history   Other Paternal Grandfather        unknown medical history    Social History   Socioeconomic History   Marital status: Single    Spouse name: Not on file   Number of children: 2   Years of education: Not on file   Highest education level: Not on file  Occupational History   Not on file  Tobacco Use   Smoking status: Every Day    Packs/day: 0.15    Types: Cigarettes   Smokeless tobacco: Never   Tobacco comments:    3-4 cigarettes a day    Patient doesn't drink heavy anymore    Patient resides at [facility] for Alcohol abuse  Vaping Use   Vaping Use: Some days   Substances: Nicotine, Flavoring  Substance and Sexual Activity   Alcohol use: Not Currently    Alcohol/week: 4.0 standard drinks    Types: 4 Cans of beer per week    Comment: quit 06/2020 pt states "a lot. whatever I can get.", drinks everyday   Drug use: Not Currently    Types: Cocaine    Comment: last use 2018 (tried cocaine)   Sexual activity: Yes    Birth control/protection: Condom  Other Topics Concern   Not on file  Social History Narrative   Not on file   Social Determinants of Health   Financial Resource Strain: Not on file  Food Insecurity: Food Insecurity Present   Worried About [place] in the Last Year: Sometimes true   Ran Out of Food in the Last Year: Sometimes true  Transportation Needs: No Transportation Needs   Lack of Transportation (Medical): No   Lack of Transportation (Non-Medical): No  Physical Activity: Not on file  Stress: Not on file  Social Connections: Not on file  Intimate Partner Violence: Not on file    Outpatient Medications Prior to Visit  Medication Sig Dispense Refill   acetaminophen (TYLENOL) 500 MG tablet Take 2 tablets (1,000 mg total) by mouth 2 (two) times daily. (Patient taking differently: Take 1,000 mg by mouth 2 (two) times daily. Taking two times daily) 60 tablet 0   calcium carbonate (OS-CAL) 1250  (500 Ca) MG chewable tablet Chew 1 tablet by mouth daily. 2-4 times per day around mealtime     amLODipine (NORVASC) 5 MG tablet Take 1 tablet (5 mg total) by mouth once daily. 30 tablet 2   pantoprazole (PROTONIX) 40 MG tablet Take 1 tablet (40 mg total) by mouth once daily. 30 tablet 2   Multiple Vitamin (MULTIVITAMIN WITH MINERALS) TABS tablet Take 1 tablet by mouth daily. (Patient not taking: Reported on 07/23/2021)     No facility-administered medications prior to visit.    Allergies  Allergen Reactions   Tramadol Nausea And Vomiting    ROS Review of Systems  Constitutional: Negative.   Eyes: Negative.   Respiratory: Negative.    Cardiovascular: Negative.   Gastrointestinal:  Positive for abdominal pain (in the morning).  Neurological: Negative.   Psychiatric/Behavioral: Negative.       Objective:    Physical Exam HENT:     Head: Normocephalic and atraumatic.     Mouth/Throat:     Mouth: Mucous membranes are moist.  Eyes:     Extraocular Movements: Extraocular movements intact.     Conjunctiva/sclera: Conjunctivae normal.     Pupils: Pupils are equal, round, and reactive to light.  Cardiovascular:     Rate and Rhythm: Normal rate and regular rhythm.     Pulses: Normal pulses.     Heart sounds: Normal heart sounds.  Pulmonary:     Effort: Pulmonary effort is normal.     Breath sounds: Normal breath sounds.  Abdominal:     General: Bowel sounds are normal. There is no distension.     Palpations: Abdomen is soft.     Tenderness: There is no abdominal tenderness. There is no guarding.  Skin:    General: Skin is warm.  Neurological:     General: No focal deficit present.     Mental Status: He is alert and oriented to person, place, and time. Mental status is at baseline.  Psychiatric:        Mood and Affect: Mood normal.        Behavior: Behavior normal.        Thought Content: Thought content normal.        Judgment: Judgment normal.    BP 119/75 (BP  Location: Left Arm, Patient Position: Sitting, Cuff Size: Large)    Pulse 87    Temp 98.1 F (36.7 C) (Oral)    Resp 16    Ht 5' 11"  (1.803 m)    Wt 172 lb 12.8 oz (78.4 kg)    SpO2 98%  BMI 24.10 kg/m  Wt Readings from Last 3 Encounters:  07/23/21 172 lb 12.8 oz (78.4 kg)  06/18/21 165 lb 11.2 oz (75.2 kg)  05/21/21 154 lb 1.6 oz (69.9 kg)     Health Maintenance Due  Topic Date Due   Pneumococcal Vaccine 54-48 Years old (1 - PCV) Never done   HIV Screening  Never done   Hepatitis C Screening  Never done   COVID-19 Vaccine (4 - Booster for Pfizer series) 08/22/2020    There are no preventive care reminders to display for this patient.  Lab Results  Component Value Date   TSH 0.700 02/26/2021   Lab Results  Component Value Date   WBC 8.7 04/04/2021   HGB 15.3 04/04/2021   HCT 45.2 04/04/2021   MCV 88.1 04/04/2021   PLT 269 04/04/2021   Lab Results  Component Value Date   NA 143 04/04/2021   K 3.7 04/04/2021   CO2 26 04/04/2021   GLUCOSE 108 (H) 04/04/2021   BUN 8 04/04/2021   CREATININE 0.78 04/04/2021   BILITOT 0.8 04/04/2021   ALKPHOS 57 04/04/2021   AST 18 04/04/2021   ALT 11 04/04/2021   PROT 8.0 04/04/2021   ALBUMIN 4.6 04/04/2021   CALCIUM 9.0 04/04/2021   ANIONGAP 11 04/04/2021   EGFR 121 02/26/2021   Lab Results  Component Value Date   CHOL 126 12/24/2020   Lab Results  Component Value Date   HDL 47 12/24/2020   Lab Results  Component Value Date   LDLCALC 61 12/24/2020   Lab Results  Component Value Date   TRIG 94 12/24/2020   Lab Results  Component Value Date   CHOLHDL 2.7 12/24/2020   Lab Results  Component Value Date   HGBA1C 5.3 12/24/2020      Assessment & Plan:     1. Essential hypertension - His blood pressure is under control, will continue on current medication, and DASH diet. - amLODipine (NORVASC) 5 MG tablet; Take 1 tablet (5 mg total) by mouth once daily.  Dispense: 30 tablet; Refill: 2  2. Gastroesophageal  reflux disease without esophagitis - His acid reflux is not under control, will take Pepcid prior to Lunch. He was educated on medication side effects and advised to notify clinic. -Avoid spicy, fatty and fried food -Avoid sodas and sour juices -Avoid heavy meals -Avoid eating 4 hours before bedtime -Elevate head of bed at night - famotidine (PEPCID) 20 MG tablet; Take 1 tablet (20 mg total) by mouth once daily.  Dispense: 30 tablet; Refill: 0 - He was encouraged to complete [facility] financial application for Ambulatory referral to Gastroenterology - pantoprazole (PROTONIX) 40 MG tablet; Take 1 tablet (40 mg total) by mouth once daily.  Dispense: 30 tablet; Refill: 2  3. Generalized abdominal pain - He was encouraged to complete [facility] financial application for  - Ambulatory referral to Gastroenterology    Follow-up: Return in about 4 weeks (around 08/20/2021), or if symptoms worsen or fail to improve.    Tawanda Schall Jerold Coombe, NP

## 2021-07-27 ENCOUNTER — Telehealth: Payer: Self-pay

## 2021-07-27 NOTE — Telephone Encounter (Signed)
CALLED PATIENT NO ANSWER LEFT VOICEMAIL FOR A CALL BACK °Letter sent °

## 2021-07-30 ENCOUNTER — Other Ambulatory Visit: Payer: Self-pay

## 2021-07-30 ENCOUNTER — Ambulatory Visit: Payer: Self-pay

## 2021-08-06 ENCOUNTER — Other Ambulatory Visit: Payer: Self-pay

## 2021-08-06 MED ORDER — SERTRALINE HCL 25 MG PO TABS
ORAL_TABLET | ORAL | 1 refills | Status: DC
  Filled 2021-08-06: qty 30, 30d supply, fill #0

## 2021-08-17 ENCOUNTER — Other Ambulatory Visit: Payer: Self-pay | Admitting: Gerontology

## 2021-08-17 ENCOUNTER — Other Ambulatory Visit: Payer: Self-pay

## 2021-08-17 DIAGNOSIS — K219 Gastro-esophageal reflux disease without esophagitis: Secondary | ICD-10-CM

## 2021-08-18 ENCOUNTER — Other Ambulatory Visit: Payer: Self-pay

## 2021-08-18 MED ORDER — FAMOTIDINE 20 MG PO TABS
20.0000 mg | ORAL_TABLET | Freq: Every day | ORAL | 0 refills | Status: DC
  Filled 2021-08-18: qty 30, 30d supply, fill #0

## 2021-08-21 ENCOUNTER — Other Ambulatory Visit: Payer: Self-pay

## 2021-08-21 MED ORDER — SERTRALINE HCL 25 MG PO TABS
25.0000 mg | ORAL_TABLET | Freq: Every day | ORAL | 1 refills | Status: DC
  Filled 2021-09-02: qty 30, 30d supply, fill #0

## 2021-08-27 ENCOUNTER — Ambulatory Visit: Payer: Self-pay | Admitting: Gerontology

## 2021-08-27 ENCOUNTER — Other Ambulatory Visit: Payer: Self-pay

## 2021-08-27 ENCOUNTER — Encounter: Payer: Self-pay | Admitting: Gerontology

## 2021-08-27 VITALS — BP 117/78 | HR 103 | Temp 98.1°F | Resp 15 | Ht 71.0 in | Wt 172.0 lb

## 2021-08-27 DIAGNOSIS — I10 Essential (primary) hypertension: Secondary | ICD-10-CM

## 2021-08-27 DIAGNOSIS — K219 Gastro-esophageal reflux disease without esophagitis: Secondary | ICD-10-CM

## 2021-08-27 MED ORDER — FAMOTIDINE 20 MG PO TABS
20.0000 mg | ORAL_TABLET | Freq: Every day | ORAL | 2 refills | Status: DC
  Filled 2021-08-27: qty 30, 30d supply, fill #0
  Filled 2021-09-09: qty 90, 90d supply, fill #0

## 2021-08-27 MED ORDER — PANTOPRAZOLE SODIUM 40 MG PO TBEC
40.0000 mg | DELAYED_RELEASE_TABLET | Freq: Every day | ORAL | 2 refills | Status: DC
  Filled 2021-08-27: qty 30, 30d supply, fill #0
  Filled 2021-09-09: qty 90, 90d supply, fill #0
  Filled 2021-09-09: qty 30, 30d supply, fill #0

## 2021-08-27 MED ORDER — AMLODIPINE BESYLATE 5 MG PO TABS
5.0000 mg | ORAL_TABLET | Freq: Every day | ORAL | 2 refills | Status: DC
  Filled 2021-08-27: qty 30, 30d supply, fill #0
  Filled 2021-09-09: qty 90, 90d supply, fill #0
  Filled 2021-09-09: qty 30, 30d supply, fill #0

## 2021-08-27 NOTE — Progress Notes (Signed)
Established Patient Office Visit  Subjective:  Patient ID: Andrew Long, male    DOB: [birth year]  Age: 41 y.o. MRN: [id number]  CC:  Chief Complaint  Patient presents with   Follow-up    HPI Andrew Long  is a 41 year old male who has history of acid reflux, allergy, depression, hypertension, presents for follow-up visit. He left [facility]  2 weeks ago, because he needs to work. He states that acid reflux is improving with the addition of Pepcid, but he continues to experience intermittent nonradiating mid abdominal pain in the morning.  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  He states that he is compliant with his medications, denies side effect and continues to make healthy lifestyle changes.  He states that he has been sober from alcohol for 103 days.  Overall, he states that he is doing well and offers no further complaint.  Past Medical History:  Diagnosis Date   Acid reflux 09/06/2019   Allergy    Depression    Essential hypertension 12/13/2019   Patient denies medical problems 12/05/2014   denies   Stroke [facility])    Substance abuse ([place])    Alcohol    Past Surgical History:  Procedure Laterality Date   denies     denies surgeries   LESION EXCISION N/A 12/25/2018   Procedure: EXCISION SCALP LESION RIGHT;  Surgeon: [name], MD;  Location: [facility] ORS;  Service: General;  Laterality: N/A;    Family History  Problem Relation Age of Onset   Heart failure Mother    Diabetes Mother    Hypertension Father    Other Maternal Grandmother        unknown medical history   Other Maternal Grandfather        unknown medical history   Other Paternal Grandmother        unknown medical history   Other Paternal Grandfather        unknown medical history    Social History   Socioeconomic History   Marital status: Single    Spouse name: Not on file   Number of children: 2   Years of education: Not on file   Highest education  level: Not on file  Occupational History   Not on file  Tobacco Use   Smoking status: Every Day    Packs/day: 0.15    Types: Cigarettes   Smokeless tobacco: Never   Tobacco comments:    3-4 cigarettes a day    Patient doesn't drink heavy anymore    Patient resides at [facility] for Alcohol abuse  Vaping Use   Vaping Use: Some days   Substances: Nicotine, Flavoring  Substance and Sexual Activity   Alcohol use: Not Currently    Alcohol/week: 4.0 standard drinks    Types: 4 Cans of beer per week    Comment: quit 06/2020 pt states "a lot. whatever I can get.", drinks everyday   Drug use: Not Currently    Types: Cocaine    Comment: last use 2018 (tried cocaine)   Sexual activity: Yes    Birth control/protection: Condom  Other Topics Concern   Not on file  Social History Narrative   Not on file   Social Determinants of Health   Financial Resource Strain: Not on file  Food Insecurity: Food Insecurity Present   Worried About [place] in the Last Year: Sometimes true   Ran Out of Food in the Last Year: Sometimes true  Transportation Needs: No Data processing manager (Medical): No   Lack of Transportation (Non-Medical): No  Physical Activity: Not on file  Stress: Not on file  Social Connections: Not on file  Intimate Partner Violence: Not on file    Outpatient Medications Prior to Visit  Medication Sig Dispense Refill   acetaminophen (TYLENOL) 500 MG tablet Take 2 tablets (1,000 mg total) by mouth 2 (two) times daily. (Patient taking differently: Take by mouth 2 (two) times daily. Taking one time daily) 60 tablet 0   calcium carbonate (OS-CAL) 1250 (500 Ca) MG chewable tablet Chew 1 tablet by mouth daily. 2-4 times per day around mealtime     Multiple Vitamin (MULTIVITAMIN WITH MINERALS) TABS tablet Take 1 tablet by mouth daily.     sertraline (ZOLOFT) 25 MG tablet TAKE 1 TABLET BY MOUTH ONCE DAILY. 30 tablet 1   amLODipine (NORVASC) 5 MG tablet  Take 1 tablet (5 mg total) by mouth once daily. 30 tablet 2   famotidine (PEPCID) 20 MG tablet Take 1 tablet (20 mg total) by mouth once daily. 30 tablet 0   pantoprazole (PROTONIX) 40 MG tablet Take 1 tablet (40 mg total) by mouth once daily. 30 tablet 2   No facility-administered medications prior to visit.    Allergies  Allergen Reactions   Tramadol Nausea And Vomiting    ROS Review of Systems  Constitutional: Negative.   Eyes: Negative.   Respiratory: Negative.    Cardiovascular: Negative.   Gastrointestinal: Negative.   Neurological: Negative.   Psychiatric/Behavioral: Negative.       Objective:    Physical Exam HENT:     Head: Normocephalic and atraumatic.     Mouth/Throat:     Mouth: Mucous membranes are moist.  Eyes:     Extraocular Movements: Extraocular movements intact.     Conjunctiva/sclera: Conjunctivae normal.     Pupils: Pupils are equal, round, and reactive to light.  Cardiovascular:     Rate and Rhythm: Normal rate and regular rhythm.     Pulses: Normal pulses.     Heart sounds: Normal heart sounds.  Pulmonary:     Effort: Pulmonary effort is normal.     Breath sounds: Normal breath sounds.  Abdominal:     General: Bowel sounds are normal.     Palpations: Abdomen is soft.     Tenderness: There is no abdominal tenderness. There is no guarding.  Skin:    General: Skin is warm.  Neurological:     General: No focal deficit present.     Mental Status: He is alert and oriented to person, place, and time. Mental status is at baseline.  Psychiatric:        Mood and Affect: Mood normal.        Behavior: Behavior normal.        Thought Content: Thought content normal.        Judgment: Judgment normal.    BP 117/78 (BP Location: Right Arm, Patient Position: Sitting, Cuff Size: Large)    Pulse (!) 103    Temp 98.1 F (36.7 C) (Oral)    Resp 15    Ht _0  (1.803 m)    Wt 172 lb (78 kg)    SpO2 97%    BMI 23.99 kg/m  Wt Readings from Last 3  Encounters:  08/27/21 172 lb (78 kg)  07/23/21 172 lb 12.8 oz (78.4 kg)  06/18/21 165 lb 11.2 oz (75.2 kg)  Health Maintenance Due  Topic Date Due   HIV Screening  Never done   Hepatitis C Screening  Never done   COVID-19 Vaccine (4 - Booster for Pfizer series) 08/22/2020    There are no preventive care reminders to display for this patient.  Lab Results  Component Value Date   TSH 0.700 02/26/2021   Lab Results  Component Value Date   WBC 8.7 04/04/2021   HGB 15.3 04/04/2021   HCT 45.2 04/04/2021   MCV 88.1 04/04/2021   PLT 269 04/04/2021   Lab Results  Component Value Date   NA 143 04/04/2021   K 3.7 04/04/2021   CO2 26 04/04/2021   GLUCOSE 108 (H) 04/04/2021   BUN 8 04/04/2021   CREATININE 0.78 04/04/2021   BILITOT 0.8 04/04/2021   ALKPHOS 57 04/04/2021   AST 18 04/04/2021   ALT 11 04/04/2021   PROT 8.0 04/04/2021   ALBUMIN 4.6 04/04/2021   CALCIUM 9.0 04/04/2021   ANIONGAP 11 04/04/2021   EGFR 121 02/26/2021   Lab Results  Component Value Date   CHOL 126 12/24/2020   Lab Results  Component Value Date   HDL 47 12/24/2020   Lab Results  Component Value Date   LDLCALC 61 12/24/2020   Lab Results  Component Value Date   TRIG 94 12/24/2020   Lab Results  Component Value Date   CHOLHDL 2.7 12/24/2020   Lab Results  Component Value Date   HGBA1C 5.3 12/24/2020      Assessment & Plan:    1. Essential hypertension -Blood pressure is under control and he will continue on current medication, DASH diet. - amLODipine (NORVASC) 5 MG tablet; Take 1 tablet (5 mg total) by mouth once daily.  Dispense: 30 tablet; Refill: 2  2. Gastroesophageal reflux disease without esophagitis -His-acid reflux is improving, he will continue on current medication and was encouraged to complete the [facility] financial application for gastroenterology referral. -Avoid spicy, fatty and fried food -Avoid sodas and sour juices -Avoid heavy meals -Avoid eating 4 hours  before bedtime -Elevate head of bed at night - famotidine (PEPCID) 20 MG tablet; Take 1 tablet (20 mg total) by mouth once daily.  Dispense: 30 tablet; Refill: 2 - pantoprazole (PROTONIX) 40 MG tablet; Take 1 tablet (40 mg total) by mouth once daily.  Dispense: 30 tablet; Refill: 2    Follow-up: Return in about 2 months (around 10/27/2021), or if symptoms worsen or fail to improve.    Royalti Schauf Jerold Coombe, NP

## 2021-08-27 NOTE — Patient Instructions (Signed)
Food Choices for Gastroesophageal Reflux Disease, Adult When you have gastroesophageal reflux disease (GERD), the foods you eat and your eating habits are very important. Choosing the right foods can help ease your discomfort. Think about working with a food expert (dietitian) to help you make good choices. What are tips for following this plan? Reading food labels Look for foods that are low in saturated fat. Foods that may help with your symptoms include: Foods that have less than 5% of daily value (DV) of fat. Foods that have 0 grams of trans fat. Cooking Do not fry your food. Cook your food by baking, steaming, grilling, or broiling. These are all methods that do not need a lot of fat for cooking. To add flavor, try to use herbs that are low in spice and acidity. Meal planning  Choose healthy foods that are low in fat, such as: Fruits and vegetables. Whole grains. Low-fat dairy products. Lean meats, fish, and poultry. Eat small meals often instead of eating 3 large meals each day. Eat your meals slowly in a place where you are relaxed. Avoid bending over or lying down until 2-3 hours after eating. Limit high-fat foods such as fatty meats or fried foods. Limit your intake of fatty foods, such as oils, butter, and shortening. Avoid the following as told by your doctor: Foods that cause symptoms. These may be different for different people. Keep a food diary to keep track of foods that cause symptoms. Alcohol. Drinking a lot of liquid with meals. Eating meals during the 2-3 hours before bed. Lifestyle Stay at a healthy weight. Ask your doctor what weight is healthy for you. If you need to lose weight, work with your doctor to do so safely. Exercise for at least 30 minutes on 5 or more days each week, or as told by your doctor. Wear loose-fitting clothes. Do not smoke or use any products that contain nicotine or tobacco. If you need help quitting, ask your doctor. Sleep with the head  of your bed higher than your feet. Use a wedge under the mattress or blocks under the bed frame to raise the head of the bed. Chew sugar-free gum after meals. What foods should eat? Eat a healthy, well-balanced diet of fruits, vegetables, whole grains, low-fat dairy products, lean meats, fish, and poultry. Each person is different. Foods that may cause symptoms in one person may not cause any symptoms in another person. Work with your doctor to find foods that are safe for you. The items listed above may not be a complete list of what you can eat and drink. Contact a food expert for more options. What foods should I avoid? Limiting some of these foods may help in managing the symptoms of GERD. Everyone is different. Talk with a food expert or your doctor to help you find the exact foods to avoid, if any. Fruits Any fruits prepared with added fat. Any fruits that cause symptoms. For some people, this may include citrus fruits, such as oranges, grapefruit, pineapple, and lemons. Vegetables Deep-fried vegetables. French fries. Any vegetables prepared with added fat. Any vegetables that cause symptoms. For some people, this may include tomatoes and tomato products, chili peppers, onions and garlic, and horseradish. Grains Pastries or quick breads with added fat. Meats and other proteins High-fat meats, such as fatty beef or pork, hot dogs, ribs, ham, sausage, salami, and bacon. Fried meat or protein, including fried fish and fried chicken. Nuts and nut butters, in large amounts. Dairy Whole milk   and chocolate milk. Sour cream. Cream. Ice cream. Cream cheese. Milkshakes. Fats and oils Butter. Margarine. Shortening. Ghee. Beverages Coffee and tea, with or without caffeine. Carbonated beverages. Sodas. Energy drinks. Fruit juice made with acidic fruits, such as orange or grapefruit. Tomato juice. Alcoholic drinks. Sweets and desserts Chocolate and cocoa. Donuts. Seasonings and condiments Pepper.  Peppermint and spearmint. Added salt. Any condiments, herbs, or seasonings that cause symptoms. For some people, this may include curry, hot sauce, or vinegar-based salad dressings. The items listed above may not be a complete list of what you should not eat and drink. Contact a food expert for more options. Questions to ask your doctor Diet and lifestyle changes are often the first steps that are taken to manage symptoms of GERD. If diet and lifestyle changes do not help, talk with your doctor about taking medicines. Where to find more information International Foundation for Gastrointestinal Disorders: aboutgerd.org Summary When you have GERD, food and lifestyle choices are very important in easing your symptoms. Eat small meals often instead of 3 large meals a day. Eat your meals slowly and in a place where you are relaxed. Avoid bending over or lying down until 2-3 hours after eating. Limit high-fat foods such as fatty meats or fried foods. This information is not intended to replace advice given to you by your health care provider. Make sure you discuss any questions you have with your health care provider. Document Revised: 01/07/2020 Document Reviewed: 01/07/2020 Elsevier Patient Education  2022 Andrew Long Eating Plan DASH stands for Dietary Approaches to Stop Hypertension. The DASH eating plan is a healthy eating plan that has been shown to: Reduce high blood pressure (hypertension). Reduce your risk for type 2 diabetes, heart disease, and stroke. Help with weight loss. What are tips for following this plan? Reading food labels Check food labels for the amount of salt (sodium) per serving. Choose foods with less than 5 percent of the Daily Value of sodium. Generally, foods with less than 300 milligrams (mg) of sodium per serving fit into this eating plan. To find whole grains, look for the word "whole" as the first word in the ingredient list. Shopping Buy products labeled as  "low-sodium" or "no salt added." Buy fresh foods. Avoid canned foods and pre-made or frozen meals. Cooking Avoid adding salt when cooking. Use salt-free seasonings or herbs instead of table salt or sea salt. Check with your health care provider or pharmacist before using salt substitutes. Do not fry foods. Cook foods using healthy methods such as baking, boiling, grilling, roasting, and broiling instead. Cook with heart-healthy oils, such as olive, canola, avocado, soybean, or sunflower oil. Meal planning  Eat a balanced diet that includes: 4 or more servings of fruits and 4 or more servings of vegetables each day. Try to fill one-half of your plate with fruits and vegetables. 6-8 servings of whole grains each day. Less than 6 oz (170 g) of lean meat, poultry, or fish each day. A 3-oz (85-g) serving of meat is about the same size as a deck of cards. One egg equals 1 oz (28 g). 2-3 servings of low-fat dairy each day. One serving is 1 cup (237 mL). 1 serving of nuts, seeds, or beans 5 times each week. 2-3 servings of heart-healthy fats. Healthy fats called omega-3 fatty acids are found in foods such as walnuts, flaxseeds, fortified milks, and eggs. These fats are also found in cold-water fish, such as sardines, salmon, and mackerel. Limit how much you  eat of: Canned or prepackaged foods. Food that is high in trans fat, such as some fried foods. Food that is high in saturated fat, such as fatty meat. Desserts and other sweets, sugary drinks, and other foods with added sugar. Full-fat dairy products. Do not salt foods before eating. Do not eat more than 4 egg yolks a week. Try to eat at least 2 vegetarian meals a week. Eat more home-cooked food and less restaurant, buffet, and fast food. Lifestyle When eating at a restaurant, ask that your food be prepared with less salt or no salt, if possible. If you drink alcohol: Limit how much you use to: 0-1 drink a day for women who are not  pregnant. 0-2 drinks a day for men. Be aware of how much alcohol is in your drink. In the [place] one drink equals one 12 oz bottle of beer (355 mL), one 5 oz glass of wine (148 mL), or one 1 oz glass of hard liquor (44 mL). General information Avoid eating more than 2,300 mg of salt a day. If you have hypertension, you may need to reduce your sodium intake to 1,500 mg a day. Work with your health care provider to maintain a healthy body weight or to lose weight. Ask what an ideal weight is for you. Get at least 30 minutes of exercise that causes your heart to beat faster (aerobic exercise) most days of the week. Activities may include walking, swimming, or biking. Work with your health care provider or dietitian to adjust your eating plan to your individual calorie needs. What foods should I eat? Fruits All fresh, dried, or frozen fruit. Canned fruit in natural juice (without added sugar). Vegetables Fresh or frozen vegetables (raw, steamed, roasted, or grilled). Low-sodium or reduced-sodium tomato and vegetable juice. Low-sodium or reduced-sodium tomato sauce and tomato paste. Low-sodium or reduced-sodium canned vegetables. Grains Whole-grain or whole-wheat bread. Whole-grain or whole-wheat pasta. Brown rice. Andrew Long. Bulgur. Whole-grain and low-sodium cereals. Pita bread. Low-fat, low-sodium crackers. Whole-wheat flour tortillas. Meats and other proteins Skinless chicken or [place]. Ground chicken or [place]. Pork with fat trimmed off. Fish and seafood. Egg whites. Dried beans, peas, or lentils. Unsalted nuts, nut butters, and seeds. Unsalted canned beans. Lean cuts of beef with fat trimmed off. Low-sodium, lean precooked or cured meat, such as sausages or meat loaves. Dairy Low-fat (1%) or fat-free (skim) milk. Reduced-fat, low-fat, or fat-free cheeses. Nonfat, low-sodium ricotta or cottage cheese. Low-fat or nonfat yogurt. Low-fat, low-sodium cheese. Fats and oils Soft margarine without  trans fats. Vegetable oil. Reduced-fat, low-fat, or light mayonnaise and salad dressings (reduced-sodium). Canola, safflower, olive, avocado, soybean, and sunflower oils. Avocado. Seasonings and condiments Herbs. Spices. Seasoning mixes without salt. Other foods Unsalted popcorn and pretzels. Fat-free sweets. The items listed above may not be a complete list of foods and beverages you can eat. Contact a dietitian for more information. What foods should I avoid? Fruits Canned fruit in a light or heavy syrup. Fried fruit. Fruit in cream or butter sauce. Vegetables Creamed or fried vegetables. Vegetables in a cheese sauce. Regular canned vegetables (not low-sodium or reduced-sodium). Regular canned tomato sauce and paste (not low-sodium or reduced-sodium). Regular tomato and vegetable juice (not low-sodium or reduced-sodium). Andrew Long. Olives. Grains Baked goods made with fat, such as croissants, muffins, or some breads. Dry pasta or rice meal packs. Meats and other proteins Fatty cuts of meat. Ribs. Fried meat. Andrew Long. Bologna, salami, and other precooked or cured meats, such as sausages or meat loaves.  Fat from the back of a pig (fatback). Bratwurst. Salted nuts and seeds. Canned beans with added salt. Canned or smoked fish. Whole eggs or egg yolks. Chicken or [place] with skin. Dairy Whole or 2% milk, cream, and half-and-half. Whole or full-fat cream cheese. Whole-fat or sweetened yogurt. Full-fat cheese. Nondairy creamers. Whipped toppings. Processed cheese and cheese spreads. Fats and oils Butter. Stick margarine. Lard. Shortening. Ghee. Bacon fat. Tropical oils, such as coconut, palm kernel, or palm oil. Seasonings and condiments Onion salt, garlic salt, seasoned salt, table salt, and sea salt. Worcestershire sauce. Tartar sauce. Barbecue sauce. Teriyaki sauce. Soy sauce, including reduced-sodium. Steak sauce. Canned and packaged gravies. Fish sauce. Oyster sauce. Cocktail sauce. Store-bought  horseradish. Ketchup. Mustard. Meat flavorings and tenderizers. Bouillon cubes. Hot sauces. Pre-made or packaged marinades. Pre-made or packaged taco seasonings. Relishes. Regular salad dressings. Other foods Salted popcorn and pretzels. The items listed above may not be a complete list of foods and beverages you should avoid. Contact a dietitian for more information. Where to find more information National Heart, Lung, and Blood Institute: https://wilson-eaton.com/ American Heart Association: www.heart.org Academy of Nutrition and Dietetics: www.eatright.[employer]: www.kidney.org Summary The DASH eating plan is a healthy eating plan that has been shown to reduce high blood pressure (hypertension). It may also reduce your risk for type 2 diabetes, heart disease, and stroke. When on the DASH eating plan, aim to eat more fresh fruits and vegetables, whole grains, lean proteins, low-fat dairy, and heart-healthy fats. With the DASH eating plan, you should limit salt (sodium) intake to 2,300 mg a day. If you have hypertension, you may need to reduce your sodium intake to 1,500 mg a day. Work with your health care provider or dietitian to adjust your eating plan to your individual calorie needs. This information is not intended to replace advice given to you by your health care provider. Make sure you discuss any questions you have with your health care provider. Document Revised: 06/01/2019 Document Reviewed: 06/01/2019 Elsevier Patient Education  2022 [employer].

## 2021-09-02 ENCOUNTER — Other Ambulatory Visit: Payer: Self-pay

## 2021-09-03 ENCOUNTER — Other Ambulatory Visit: Payer: Self-pay

## 2021-09-09 ENCOUNTER — Other Ambulatory Visit: Payer: Self-pay

## 2021-09-10 ENCOUNTER — Other Ambulatory Visit: Payer: Self-pay

## 2021-09-14 ENCOUNTER — Other Ambulatory Visit: Payer: Self-pay

## 2021-09-18 ENCOUNTER — Other Ambulatory Visit: Payer: Self-pay

## 2021-09-18 MED ORDER — SERTRALINE HCL 25 MG PO TABS
25.0000 mg | ORAL_TABLET | Freq: Every day | ORAL | 2 refills | Status: DC
  Filled 2021-09-18 – 2021-09-30 (×2): qty 30, 30d supply, fill #0
  Filled 2021-10-27 – 2021-10-28 (×2): qty 30, 30d supply, fill #1
  Filled 2021-11-29: qty 30, 30d supply, fill #2
  Filled 2021-11-30: qty 30, 30d supply, fill #0

## 2021-09-23 ENCOUNTER — Other Ambulatory Visit: Payer: Self-pay

## 2021-09-30 ENCOUNTER — Other Ambulatory Visit: Payer: Self-pay

## 2021-10-27 ENCOUNTER — Other Ambulatory Visit: Payer: Self-pay

## 2021-10-27 ENCOUNTER — Other Ambulatory Visit: Payer: Self-pay | Admitting: Gerontology

## 2021-10-27 DIAGNOSIS — K219 Gastro-esophageal reflux disease without esophagitis: Secondary | ICD-10-CM

## 2021-10-27 MED FILL — Pantoprazole Sodium EC Tab 40 MG (Base Equiv): ORAL | 30 days supply | Qty: 30 | Fill #0 | Status: CN

## 2021-10-28 ENCOUNTER — Other Ambulatory Visit: Payer: Self-pay

## 2021-10-29 ENCOUNTER — Other Ambulatory Visit: Payer: Self-pay

## 2021-10-29 ENCOUNTER — Encounter: Payer: Self-pay | Admitting: Gerontology

## 2021-10-29 ENCOUNTER — Ambulatory Visit: Payer: Self-pay | Admitting: Gerontology

## 2021-10-29 VITALS — BP 117/78 | HR 93 | Temp 98.2°F | Resp 16 | Ht 71.0 in | Wt 171.0 lb

## 2021-10-29 DIAGNOSIS — I10 Essential (primary) hypertension: Secondary | ICD-10-CM

## 2021-10-29 DIAGNOSIS — K219 Gastro-esophageal reflux disease without esophagitis: Secondary | ICD-10-CM

## 2021-10-29 MED ORDER — AMLODIPINE BESYLATE 5 MG PO TABS
5.0000 mg | ORAL_TABLET | Freq: Every day | ORAL | 2 refills | Status: DC
  Filled 2021-10-29 – 2021-12-15 (×3): qty 30, 30d supply, fill #0
  Filled 2022-01-17: qty 30, 30d supply, fill #1
  Filled 2022-02-13 – 2022-02-15 (×2): qty 30, 30d supply, fill #2

## 2021-10-29 MED ORDER — FAMOTIDINE 20 MG PO TABS
20.0000 mg | ORAL_TABLET | Freq: Every day | ORAL | 2 refills | Status: AC
Start: 2021-10-29 — End: ?
  Filled 2021-10-29 – 2022-03-08 (×4): qty 30, 30d supply, fill #0

## 2021-10-29 NOTE — Patient Instructions (Signed)
Smoking Tobacco Information, Adult Smoking tobacco can be harmful to your health. Tobacco contains a toxic colorless chemical called nicotine. Nicotine causes changes in your brain that make you want more and more. This is called addiction. This can make it hard to stop smoking once you start. Tobacco also has other toxic chemicals that can hurt your body and raise your risk of many cancers. Menthol or "lite" tobacco or cigarette brands are not safer than regular brands. How can smoking tobacco affect me? Smoking tobacco puts you at risk for: Cancer. Smoking is most commonly associated with lung cancer, but can also lead to cancer in other parts of the body. Chronic obstructive pulmonary disease (COPD). This is a long-term lung condition that makes it hard to breathe. It also gets worse over time. High blood pressure (hypertension), heart disease, stroke, heart attack, and lung infections, such as pneumonia. Cataracts. This is when the lenses in the eyes become clouded. Digestive problems. This may include peptic ulcers, heartburn, and gastroesophageal reflux disease (GERD). Oral health problems, such as gum disease, mouth sores, and tooth loss. Loss of taste and smell. Smoking also affects how you look and smell. Smoking may cause: Wrinkles. Yellow or stained teeth, fingers, and fingernails. Bad breath. Bad-smelling clothes and hair. Smoking tobacco can also affect your social life, because: It may be challenging to find places to smoke when away from home. Many workplaces, restaurants, hotels, and public places are tobacco-free. Smoking is expensive. This is due to the cost of tobacco and the long-term costs of treating health problems from smoking. Secondhand smoke may affect those around you. Secondhand smoke can cause lung cancer, breathing problems, and heart disease. Children of smokers have a higher risk for: Sudden infant death syndrome (SIDS). Ear infections. Lung infections. What  actions can I take to prevent health problems? Quit smoking  Do not start smoking. Quit if you already smoke. Do not replace cigarette smoking with vaping devices, such as e-cigarettes. Make a plan to quit smoking and commit to it. Look for programs to help you, and ask your health care provider for recommendations and ideas. Set a date and write down all the reasons you want to quit. Let your friends and family know you are quitting so they can help and support you. Consider finding friends who also want to quit. It can be easier to quit with someone else, so that you can support each other. Talk with your health care provider about using nicotine replacement medicines to help you quit. These include gum, lozenges, patches, sprays, or pills. If you try to quit but return to smoking, stay positive. It is common to slip up when you first quit, so take it one day at a time. Be prepared for cravings. When you feel the urge to smoke, chew gum or suck on hard candy. Lifestyle Stay busy. Take care of your body. Get plenty of exercise, eat a healthy diet, and drink plenty of water. Find ways to manage your stress, such as meditation, yoga, exercise, or time spent with friends and family. Ask your health care provider about having regular tests (screenings) to check for cancer. This may include blood tests, imaging tests, and other tests. Where to find support To get support to quit smoking, consider: Asking your health care provider for more information and resources. Joining a support group for people who want to quit smoking in your local community. There are many effective programs that may help you to quit. Calling the smokefree.gov counselor   helpline at 1-800-QUIT-NOW (1-800-784-8669). Where to find more information You may find more information about quitting smoking from: Centers for Disease Control and Prevention: cdc.gov/tobacco Smokefree.gov: smokefree.gov American Lung Association:  freedomfromsmoking.org Contact a health care provider if: You have problems breathing. Your lips, nose, or fingers turn blue. You have chest pain. You are coughing up blood. You feel like you will faint. You have other health changes that cause you to worry. Summary Smoking tobacco can negatively affect your health, the health of those around you, your finances, and your social life. Do not start smoking. Quit if you already smoke. If you need help quitting, ask your health care provider. Consider joining a support group for people in your local community who want to quit smoking. There are many effective programs that may help you to quit. This information is not intended to replace advice given to you by your health care provider. Make sure you discuss any questions you have with your health care provider. Document Revised: 06/23/2021 Document Reviewed: 06/23/2021 Elsevier Patient Education  2023 [employer]  

## 2021-10-29 NOTE — Progress Notes (Signed)
New Patient Office Visit ? ?Subjective   ? ?Patient ID: Andrew PlumRonald D Sarr, male    DOB: 1980/09/15  Age: 41 y.o. MRN: [id number] ? ?CC:  ?Chief Complaint  ?Patient presents with  ? Follow-up  ? ?HPI ?Andrew Long  is a 41 year old male who has history of acid reflux, allergy, depression, hypertension, presents for follow-up visit. He states that acid reflux has  improved with the combination of Protonix and Pepcid,  he states that he does not experience the mid abdominal pain since he started his medications .  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 He states that he is compliant with his medications, denies side effect and continues to make healthy lifestyle changes.  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  Overall, he states that he is doing well and and denies any other complaint. ? ? ? ?Outpatient Encounter Medications as of 10/29/2021  ?Medication Sig  ? acetaminophen (TYLENOL) 500 MG tablet Take 2 tablets (1,000 mg total) by mouth 2 (two) times daily. (Patient taking differently: Take by mouth 2 (two) times daily. Taking one time daily)  ? amLODipine (NORVASC) 5 MG tablet Take 1 tablet (5 mg total) by mouth once daily.  ? famotidine (PEPCID) 20 MG tablet Take 1 tablet (20 mg total) by mouth once daily.  ? Multiple Vitamin (MULTIVITAMIN WITH MINERALS) TABS tablet Take 1 tablet by mouth daily.  ? naproxen sodium (ALEVE) 220 MG tablet Take 440 mg by mouth daily as needed. Alternates with Tylenol  ? pantoprazole (PROTONIX) 40 MG tablet Take 1 tablet (40 mg total) by mouth once daily.  ? sertraline (ZOLOFT) 25 MG tablet Take 1 tablet (25 mg total) by mouth once daily.  ? [DISCONTINUED] calcium carbonate (OS-CAL) 1250 (500 Ca) MG chewable tablet Chew 1 tablet by mouth daily. 2-4 times per day around mealtime  ? ?No facility-administered encounter  medications on file as of 10/29/2021.  ? ? ?Past Medical History:  ?Diagnosis Date  ? Acid reflux 09/06/2019  ? Allergy   ? Depression   ? Essential hypertension 12/13/2019  ? Patient denies medical problems 12/05/2014  ? denies  ? Stroke [facility](HCC)   ? Substance abuse (HCC)   ? Alcohol  ? ? ?Past Surgical History:  ?Procedure Laterality Date  ? denies    ? denies surgeries  ? LESION EXCISION N/A 12/25/2018  ? Procedure: EXCISION SCALP LESION RIGHT;  Surgeon: Henrene DodgePiscoya, Jose, MD;  Location: [facility] ORS;  Service: General;  Laterality: N/A;  ? ? ?Family History  ?Problem Relation Age of Onset  ? Heart failure Mother   ? Diabetes Mother   ? Hypertension Father   ? Other Maternal Grandmother   ?     unknown medical history  ? Other Maternal Grandfather   ?     unknown medical history  ? Other Paternal Grandmother   ?     unknown medical history  ? Other Paternal Grandfather   ?     unknown medical history  ? ? ?Social History  ? ?Socioeconomic History  ? Marital status: Single  ?  Spouse name: Not on file  ? Number of children: 2  ? Years of education: Not on file  ? Highest education level: Not on file  ?Occupational History  ? Not on file  ?  Tobacco Use  ? Smoking status: Every Day  ?  Packs/day: 0.15  ?  Types: Cigarettes  ? Smokeless tobacco: Never  ? Tobacco comments:  ?  3-4 cigarettes a day  ?Vaping Use  ? Vaping Use: Some days  ? Substances: Nicotine, Flavoring  ?Substance and Sexual Activity  ? Alcohol use: Not Currently  ?  Alcohol/week: 4.0 standard drinks  ?  Types: 4 Cans of beer per week  ?  Comment: quit 06/2020 pt states "a lot. whatever I can get.", drinks everyday  ? Drug use: Not Currently  ?  Types: Cocaine  ?  Comment: last use 2018 (tried cocaine)  ? Sexual activity: Yes  ?  Birth control/protection: Condom  ?Other Topics Concern  ? Not on file  ?Social History Narrative  ? Not on file  ? ?Social Determinants of Health  ? ?Financial Resource Strain: Not on file  ?Food Insecurity: Food Insecurity Present  ?  Worried About Programme researcher, broadcasting/film/video in the Last Year: Sometimes true  ? Ran Out of Food in the Last Year: Sometimes true  ?Transportation Needs: No Transportation Needs  ? Lack of Transportation (Medical): No  ? Lack of Transportation (Non-Medical): No  ?Physical Activity: Not on file  ?Stress: Not on file  ?Social Connections: Not on file  ?Intimate Partner Violence: Not on file  ? ? ?Review of Systems  ?Constitutional: Negative.   ?HENT: Negative.    ?Respiratory: Negative.    ?Cardiovascular: Negative.   ?Gastrointestinal: Negative.   ?Genitourinary: Negative.   ?Skin: Negative.   ?Neurological: Negative.   ?Psychiatric/Behavioral: Negative.    ? ?  ? ? ?Objective   ? ?BP 117/78 (BP Location: Left Arm, Patient Position: Sitting, Cuff Size: Large)   Pulse 93   Temp 98.2 ?F (36.8 ?C) (Oral)   Resp 16   Ht 5\' 11"  (1.803 m)   Wt 171 lb (77.6 kg)   SpO2 96%   BMI 23.85 kg/m?  ? ?Physical Exam ?Constitutional:   ?   Appearance: Normal appearance.  ?HENT:  ?   Head: Normocephalic.  ?Cardiovascular:  ?   Rate and Rhythm: Normal rate and regular rhythm.  ?Pulmonary:  ?   Effort: Pulmonary effort is normal.  ?Abdominal:  ?   General: Abdomen is flat. Bowel sounds are normal.  ?   Palpations: Abdomen is soft.  ?Musculoskeletal:     ?   General: Normal range of motion.  ?   Cervical back: Normal range of motion.  ?Skin: ?   General: Skin is warm.  ?Neurological:  ?   Mental Status: He is alert and oriented to person, place, and time.  ?Psychiatric:     ?   Mood and Affect: Mood normal.  ? ? ? ?  ? ?Assessment & Plan:  ?1. Essential hypertension ?Blood pressure is under control and he will continue on current medication, DASH diet. ?- amLODipine (NORVASC) 5 MG tablet; Take 1 tablet (5 mg total) by mouth once daily.   ?2. Gastroesophageal reflux disease without esophagitis ?His-acid reflux  is much  improved, he will continue on current medications,  famotidine (PEPCID) 20 MG daily  and pantoprazole (PROTONIX) 40 MG  tablet daily ?He was encouraged to complete the [facility] financial application for gastroenterology referral. ?-Avoid spicy, fatty and fried food ?-Avoid sodas and sour juices ?-Avoid heavy meals ?-Avoid eating 4 hours before bedtime ?-Elevate head of bed at night ?  ?Follow up:in 2 months around (12/29/21) or if symptoms  worsen or fail to improve. ? ?Mickie Hillier, RN ? ? ?

## 2021-11-13 ENCOUNTER — Other Ambulatory Visit: Payer: Self-pay

## 2021-11-30 ENCOUNTER — Other Ambulatory Visit: Payer: Self-pay

## 2021-12-03 ENCOUNTER — Other Ambulatory Visit: Payer: Self-pay

## 2021-12-15 ENCOUNTER — Other Ambulatory Visit: Payer: Self-pay

## 2021-12-15 MED FILL — Pantoprazole Sodium EC Tab 40 MG (Base Equiv): ORAL | 30 days supply | Qty: 30 | Fill #0 | Status: CN

## 2021-12-15 MED FILL — Pantoprazole Sodium EC Tab 40 MG (Base Equiv): ORAL | 30 days supply | Qty: 30 | Fill #0 | Status: AC

## 2021-12-16 ENCOUNTER — Other Ambulatory Visit: Payer: Self-pay | Admitting: Pharmacy Technician

## 2021-12-16 ENCOUNTER — Other Ambulatory Visit: Payer: Self-pay

## 2021-12-16 NOTE — Patient Outreach (Signed)
Patient has prescription drug coverage with [employer]	[place] and [address].  No longer qualifies for free medications.  Sherilyn Dacosta Patient Advocate Specialist [facility]

## 2021-12-29 ENCOUNTER — Encounter: Payer: Self-pay | Admitting: Gerontology

## 2021-12-29 ENCOUNTER — Ambulatory Visit: Payer: Self-pay | Admitting: Gerontology

## 2021-12-29 ENCOUNTER — Other Ambulatory Visit: Payer: Self-pay

## 2021-12-29 VITALS — BP 96/64 | HR 86 | Resp 16 | Wt 165.4 lb

## 2021-12-29 DIAGNOSIS — I10 Essential (primary) hypertension: Secondary | ICD-10-CM

## 2021-12-29 DIAGNOSIS — K219 Gastro-esophageal reflux disease without esophagitis: Secondary | ICD-10-CM

## 2021-12-29 DIAGNOSIS — Z0000 Encounter for general adult medical examination without abnormal findings: Secondary | ICD-10-CM

## 2021-12-29 DIAGNOSIS — R202 Paresthesia of skin: Secondary | ICD-10-CM

## 2021-12-29 MED ORDER — GABAPENTIN 100 MG PO CAPS
100.0000 mg | ORAL_CAPSULE | Freq: Every day | ORAL | 0 refills | Status: DC
Start: 2021-12-29 — End: 2022-01-19
  Filled 2021-12-29: qty 30, 30d supply, fill #0

## 2021-12-29 NOTE — Progress Notes (Signed)
Established Patient Office Visit  Subjective   Patient ID: Andrew Long, male    DOB: Mar 27, 1981  Age: 41 y.o. MRN: [id number]  No chief complaint on file.   HPI  Andrew Long  is a 41 year old male who has history of acid reflux, allergy, depression, hypertension, presents for follow-up visit. He states that he is compliant with his medications, denies side effects and continues to make healthy lifestyle changes. He states that the intermittent numbness to his hands started few weeks ago, but taking his left over 400 mg of gabapentin relieves symptoms.  He denies muscle and motor weakness.  He states that his mood is good, denies suicidal or homicidal ideation and follows up at [facility] for his mental health care. Overall, he states that he is doing well and offers no further complaint.  Review of Systems  Constitutional: Negative.   Eyes: Negative.   Respiratory: Negative.    Cardiovascular: Negative.   Genitourinary: Negative.   Neurological:        Numbness to hands      Objective:     BP 96/64 (BP Location: Right Arm, Patient Position: Sitting, Cuff Size: Large)   Pulse 86   Resp 16   Wt 165 lb 6.4 oz (75 kg)   BMI 23.07 kg/m  BP Readings from Last 3 Encounters:  12/29/21 96/64  10/29/21 117/78  08/27/21 117/78   Wt Readings from Last 3 Encounters:  12/29/21 165 lb 6.4 oz (75 kg)  10/29/21 171 lb (77.6 kg)  08/27/21 172 lb (78 kg)      Physical Exam HENT:     Head: Normocephalic and atraumatic.     Mouth/Throat:     Mouth: Mucous membranes are moist.  Eyes:     Extraocular Movements: Extraocular movements intact.     Conjunctiva/sclera: Conjunctivae normal.     Pupils: Pupils are equal, round, and reactive to light.  Cardiovascular:     Rate and Rhythm: Normal rate and regular rhythm.     Pulses: Normal pulses.     Heart sounds: Normal heart sounds.  Pulmonary:     Effort: Pulmonary effort is normal.     Breath sounds: Normal breath sounds.  Skin:     General: Skin is warm.  Neurological:     General: No focal deficit present.     Mental Status: He is alert and oriented to person, place, and time. Mental status is at baseline.  Psychiatric:        Mood and Affect: Mood normal.        Behavior: Behavior normal.        Thought Content: Thought content normal.        Judgment: Judgment normal.      No results found for any visits on 12/29/21.  Last CBC Lab Results  Component Value Date   WBC 8.7 04/04/2021   HGB 15.3 04/04/2021   HCT 45.2 04/04/2021   MCV 88.1 04/04/2021   MCH 29.8 04/04/2021   RDW 15.2 04/04/2021   PLT 269 69/50/7225   Last metabolic panel Lab Results  Component Value Date   GLUCOSE 108 (H) 04/04/2021   NA 143 04/04/2021   K 3.7 04/04/2021   CL 106 04/04/2021   CO2 26 04/04/2021   BUN 8 04/04/2021   CREATININE 0.78 04/04/2021   GFRNONAA >60 04/04/2021   CALCIUM 9.0 04/04/2021   PROT 8.0 04/04/2021   ALBUMIN 4.6 04/04/2021   LABGLOB 3.0 02/26/2021   AGRATIO 1.7  02/26/2021   BILITOT 0.8 04/04/2021   ALKPHOS 57 04/04/2021   AST 18 04/04/2021   ALT 11 04/04/2021   ANIONGAP 11 04/04/2021   Last lipids Lab Results  Component Value Date   CHOL 126 12/24/2020   HDL 47 12/24/2020   LDLCALC 61 12/24/2020   TRIG 94 12/24/2020   CHOLHDL 2.7 12/24/2020   Last hemoglobin A1c Lab Results  Component Value Date   HGBA1C 5.3 12/24/2020   Last thyroid functions Lab Results  Component Value Date   TSH 0.700 02/26/2021      The ASCVD Risk score (Arnett DK, et al., 2019) failed to calculate for the following reasons:   The 2019 ASCVD risk score is only valid for ages 37 to 8    Assessment & Plan:   1. Essential hypertension -His blood pressure is under control, he will continue on current medication, DASH diet and exercise as tolerated.  2. Gastroesophageal reflux disease without esophagitis -His acid reflux is under control, he will continue on current medication. -Avoid spicy, fatty and  fried food -Avoid sodas and sour juices -Avoid heavy meals -Avoid eating 4 hours before bedtime -Elevate head of bed at night  3. Paresthesia of both hands -He was started on gabapentin, educated on medication side effects and was advised to notify clinic.  He was advised to go to the emergency room with worsening symptoms. - gabapentin (NEURONTIN) 100 MG capsule; Take 1 capsule (100 mg total) by mouth at bedtime.  Dispense: 30 capsule; Refill: 0  4. Health care maintenance -Routine labs will be checked. - CBC with Differential/Platelet; Future - Lipid panel; Future - Comp Met (CMET); Future - TSH; Future   Return in about 15 weeks (around 04/13/2022).    Alta Shober Jerold Coombe, NP

## 2021-12-31 ENCOUNTER — Other Ambulatory Visit: Payer: Self-pay

## 2022-01-01 ENCOUNTER — Other Ambulatory Visit: Payer: Self-pay

## 2022-01-04 ENCOUNTER — Other Ambulatory Visit: Payer: Self-pay

## 2022-01-04 MED ORDER — SERTRALINE HCL 25 MG PO TABS
ORAL_TABLET | ORAL | 0 refills | Status: DC
  Filled 2022-01-04: qty 30, 30d supply, fill #0

## 2022-01-05 ENCOUNTER — Other Ambulatory Visit: Payer: Self-pay

## 2022-01-15 ENCOUNTER — Other Ambulatory Visit: Payer: Self-pay

## 2022-01-15 MED ORDER — SERTRALINE HCL 25 MG PO TABS
ORAL_TABLET | ORAL | 4 refills | Status: AC
  Filled 2022-01-15 – 2022-01-26 (×2): qty 30, 30d supply, fill #0
  Filled 2022-03-01: qty 30, 30d supply, fill #1
  Filled 2022-04-09: qty 30, 30d supply, fill #2
  Filled 2022-05-04: qty 30, 30d supply, fill #3
  Filled 2022-06-01: qty 30, 30d supply, fill #4

## 2022-01-17 ENCOUNTER — Other Ambulatory Visit: Payer: Self-pay

## 2022-01-17 MED FILL — Pantoprazole Sodium EC Tab 40 MG (Base Equiv): ORAL | 30 days supply | Qty: 30 | Fill #1 | Status: AC

## 2022-01-18 ENCOUNTER — Other Ambulatory Visit: Payer: Self-pay

## 2022-01-19 ENCOUNTER — Other Ambulatory Visit: Payer: Self-pay

## 2022-01-19 ENCOUNTER — Other Ambulatory Visit: Payer: Self-pay | Admitting: Gerontology

## 2022-01-19 DIAGNOSIS — R202 Paresthesia of skin: Secondary | ICD-10-CM

## 2022-01-19 MED ORDER — GABAPENTIN 400 MG PO CAPS
400.0000 mg | ORAL_CAPSULE | Freq: Every day | ORAL | 2 refills | Status: DC
  Filled 2022-01-19: qty 30, 30d supply, fill #0

## 2022-01-19 MED ORDER — GABAPENTIN 400 MG PO CAPS
400.0000 mg | ORAL_CAPSULE | Freq: Two times a day (BID) | ORAL | 1 refills | Status: AC
Start: 2022-01-19 — End: ?
  Filled 2022-01-19 – 2022-03-01 (×3): qty 60, 30d supply, fill #0

## 2022-01-19 NOTE — Progress Notes (Signed)
Patients reports being taking 400 mg of gabapentin bid, and paresthesia to both hands is worsening. Was advised to pick up 400 mg gabapentin bid and referral to Neurology was ordered. He was advised to go to the ED for worsening symptoms.

## 2022-01-26 ENCOUNTER — Other Ambulatory Visit: Payer: Self-pay

## 2022-01-28 ENCOUNTER — Other Ambulatory Visit: Payer: Self-pay

## 2022-02-02 ENCOUNTER — Other Ambulatory Visit: Payer: Self-pay

## 2022-02-13 ENCOUNTER — Other Ambulatory Visit: Payer: Self-pay

## 2022-02-13 MED FILL — Pantoprazole Sodium EC Tab 40 MG (Base Equiv): ORAL | 30 days supply | Qty: 30 | Fill #2 | Status: AC

## 2022-02-15 ENCOUNTER — Other Ambulatory Visit: Payer: Self-pay

## 2022-02-16 ENCOUNTER — Emergency Department
Admission: EM | Admit: 2022-02-16 | Discharge: 2022-02-16 | Disposition: A | Discharge status: Home / Self Care | Payer: Managed Care, Other (non HMO) | Attending: Emergency Medicine | Admitting: Emergency Medicine

## 2022-02-16 ENCOUNTER — Other Ambulatory Visit: Payer: Self-pay

## 2022-02-16 ENCOUNTER — Encounter: Payer: Self-pay | Admitting: Emergency Medicine

## 2022-02-16 ENCOUNTER — Emergency Department: Payer: Managed Care, Other (non HMO)

## 2022-02-16 DIAGNOSIS — Z79899 Other long term (current) drug therapy: Secondary | ICD-10-CM | POA: Insufficient documentation

## 2022-02-16 DIAGNOSIS — S99922A Unspecified injury of left foot, initial encounter: Secondary | ICD-10-CM | POA: Diagnosis present

## 2022-02-16 DIAGNOSIS — W228XXA Striking against or struck by other objects, initial encounter: Secondary | ICD-10-CM | POA: Insufficient documentation

## 2022-02-16 DIAGNOSIS — S90122A Contusion of left lesser toe(s) without damage to nail, initial encounter: Secondary | ICD-10-CM | POA: Diagnosis not present

## 2022-02-16 DIAGNOSIS — I10 Essential (primary) hypertension: Secondary | ICD-10-CM | POA: Diagnosis not present

## 2022-02-16 DIAGNOSIS — Y990 Civilian activity done for income or pay: Secondary | ICD-10-CM | POA: Insufficient documentation

## 2022-02-16 MED ORDER — OXYCODONE-ACETAMINOPHEN 5-325 MG PO TABS
1.0000 | ORAL_TABLET | Freq: Once | ORAL | Status: AC
  Administered 2022-02-16: 1 via ORAL
  Filled 2022-02-16: qty 1

## 2022-02-16 MED ORDER — IBUPROFEN 800 MG PO TABS
800.0000 mg | ORAL_TABLET | Freq: Once | ORAL | Status: AC
  Administered 2022-02-16: 800 mg via ORAL
  Filled 2022-02-16: qty 1

## 2022-02-16 MED ORDER — HYDROCODONE-ACETAMINOPHEN 5-325 MG PO TABS
1.0000 | ORAL_TABLET | Freq: Four times a day (QID) | ORAL | 0 refills | Status: AC | PRN
  Filled 2022-02-16: qty 6, 2d supply, fill #0

## 2022-02-16 MED ORDER — HYDROCODONE-ACETAMINOPHEN 5-325 MG PO TABS
1.0000 | ORAL_TABLET | Freq: Once | ORAL | Status: AC
  Administered 2022-02-16: 1 via ORAL
  Filled 2022-02-16: qty 1

## 2022-02-16 MED ORDER — IBUPROFEN 800 MG PO TABS
800.0000 mg | ORAL_TABLET | Freq: Three times a day (TID) | ORAL | 0 refills | Status: AC | PRN
  Filled 2022-02-16: qty 30, 10d supply, fill #0

## 2022-02-16 NOTE — ED Triage Notes (Signed)
Pt presents via POV with complaints of left toe pain that started tonight at work. Pt states his foot got stuck between a pallet and rider jet to move the pallet. Pt ambulatory to triage - no swelling or deformities noted.

## 2022-02-16 NOTE — ED Provider Notes (Signed)
[facility] Provider Note    Event Date/Time   First MD Initiated Contact with Patient 02/16/22 [phone]     (approximate)   History   No chief complaint on file.   HPI  Andrew Long is a 41 y.o. male with history of hypertension, stroke, alcohol abuse who presents to the emergency department with complaints of left foot and left third toe pain.  Patient reports that he got his foot stuck between a pallet and a rider jet that was moving a pallet tonight at work.  States he has pain with ambulation.  No other injury.  No numbness or tingling.   History provided by patient and wife.    Past Medical History:  Diagnosis Date   Acid reflux 09/06/2019   Allergy    Depression    Essential hypertension 12/13/2019   Patient denies medical problems 12/05/2014   denies   Stroke [facility])    Substance abuse ([place])    Alcohol    Past Surgical History:  Procedure Laterality Date   denies     denies surgeries   LESION EXCISION N/A 12/25/2018   Procedure: EXCISION SCALP LESION RIGHT;  Surgeon: [name], MD;  Location: [facility] ORS;  Service: General;  Laterality: N/A;    MEDICATIONS:  Prior to Admission medications   Medication Sig Start Date End Date Taking? Authorizing Provider  acetaminophen (TYLENOL) 500 MG tablet Take 2 tablets (1,000 mg total) by mouth 2 (two) times daily. Patient taking differently: Take by mouth 2 (two) times daily. Taking one time daily 06/18/21   Iloabachie, Chioma E, NP  amLODipine (NORVASC) 5 MG tablet Take 1 tablet (5 mg total) by mouth once daily. 10/29/21   Iloabachie, Chioma E, NP  famotidine (PEPCID) 20 MG tablet Take 1 tablet (20 mg total) by mouth once daily. 10/29/21   Iloabachie, Chioma E, NP  gabapentin (NEURONTIN) 400 MG capsule Take 1 capsule (400 mg total) by mouth 2 (two) times daily. 01/19/22   Iloabachie, Chioma E, NP  Multiple Vitamin (MULTIVITAMIN WITH MINERALS) TABS tablet Take 1 tablet by mouth daily.    [provider]  pantoprazole (PROTONIX) 40 MG tablet Take 1 tablet (40 mg total) by mouth once daily. 10/27/21   Iloabachie, Chioma E, NP  sertraline (ZOLOFT) 25 MG tablet Take 1 tablet (25 mg total) by mouth once daily. 09/18/21     sertraline (ZOLOFT) 25 MG tablet one po daily 01/15/22       Physical Exam   Triage Vital Signs: ED Triage Vitals  Enc Vitals Group     BP 02/16/22 0130 117/85     Pulse Rate 02/16/22 0130 85     Resp 02/16/22 0130 16     Temp 02/16/22 0130 98.4 F (36.9 C)     Temp Source 02/16/22 0130 Oral     SpO2 02/16/22 0130 99 %     Weight 02/16/22 0127 165 lb (74.8 kg)     Height 02/16/22 0127 5\' 11"  (1.803 m)     Head Circumference --      Peak Flow --      Pain Score 02/16/22 0127 9     Pain Loc --      Pain Edu? --      Excl. in [place]? --     Most recent vital signs: Vitals:   02/16/22 0130  BP: 117/85  Pulse: 85  Resp: 16  Temp: 98.4 F (36.9 C)  SpO2: 99%  CONSTITUTIONAL: Alert and responds appropriately to questions. Well-appearing; well-nourished HEAD: Normocephalic, atraumatic EYES: Conjunctivae clear, pupils appear equal ENT: normal nose; moist mucous membranes NECK: Normal range of motion CARD: Regular rate and rhythm RESP: Normal chest excursion without splinting or tachypnea; no hypoxia or respiratory distress, speaking full sentences ABD/GI: non-distended EXT: Normal ROM in all joints, no major deformities noted, no soft tissue swelling or bruising.  Patient has some tenderness over the left third toe.  He has onychomycotic toenails bilaterally.  Extremities are warm and well-perfused.  Compartments soft.  No tenderness over the dorsal foot otherwise, ankle, proximal tibia or fibula.  Compartments of the left leg are soft.  Normal capillary refill. SKIN: Normal color for age and race, no rashes on exposed skin NEURO: Moves all extremities equally, normal speech, no facial asymmetry noted PSYCH: The patient's mood and manner are  appropriate. Grooming and personal hygiene are appropriate.  ED Results / Procedures / Treatments   LABS: (all labs ordered are listed, but only abnormal results are displayed) Labs Reviewed - No data to display   EKG:   RADIOLOGY: My personal review and interpretation of imaging: Left foot x-ray shows no fracture.  I have personally reviewed all radiology reports. DG Foot Complete Left  Result Date: 02/16/2022 CLINICAL DATA:  Left foot pain EXAM: LEFT FOOT - COMPLETE 3+ VIEW COMPARISON:  None Available. FINDINGS: There is no evidence of fracture or dislocation. There is no evidence of arthropathy or other focal bone abnormality. Soft tissues are unremarkable. IMPRESSION: Negative. Electronically Signed   By: Helyn Numbers M.D.   On: 02/16/2022 02:08     PROCEDURES:  Critical Care performed: No    Procedures    IMPRESSION / MDM / ASSESSMENT AND PLAN / ED COURSE  I reviewed the triage vital signs and the nursing notes.   Patient here with left foot pain mostly over the left third toe.     DIFFERENTIAL DIAGNOSIS (includes but not limited to):   Fracture, dislocation, contusion.  No laceration.  Patient's presentation is most consistent with acute complicated illness / injury requiring diagnostic workup.  PLAN: We will obtain x-ray of the left foot.  He received pain medication in triage.   MEDICATIONS GIVEN IN ED: Medications  oxyCODONE-acetaminophen (PERCOCET/ROXICET) 5-325 MG per tablet 1 tablet (1 tablet Oral Given 02/16/22 0130)  HYDROcodone-acetaminophen (NORCO/VICODIN) 5-325 MG per tablet 1 tablet (1 tablet Oral Given 02/16/22 0458)  ibuprofen (ADVIL) tablet 800 mg (800 mg Oral Given 02/16/22 0458)     ED COURSE: X-ray reviewed and interpreted by myself radiology and shows no fracture or dislocation.  Recommended Tylenol, Motrin for pain control at home.  His wife states that she does not think ibuprofen will be sufficient for his pain.  Given no fracture, I do  not feel he needs an extended course of narcotic analgesia but will send him with a brief course for pain control over the next couple of days.  Recommended ice, elevation.  I do not feel he needs a splint at this time given most of the tenderness is only located to the third toe.  Will give podiatry follow-up if symptoms not improving.  He is requesting a work note as he states "I do not think there is any way I can work with this".   CONSULTS: No emergent consult needed at this time for uncomplicated left third toe pain with negative x-ray.  Patient neurovascular intact distally.   OUTSIDE RECORDS REVIEWED: Reviewed patient's previous neurology note  on 07/19/2019 and previous dermatology note on 11/23/2017.     FINAL CLINICAL IMPRESSION(S) / ED DIAGNOSES   Final diagnoses:  Contusion of third toe of left foot, initial encounter     Rx / DC Orders   ED Discharge Orders          Ordered    ibuprofen (ADVIL) 800 MG tablet  Every 8 hours PRN        02/16/22 0449    HYDROcodone-acetaminophen (NORCO/VICODIN) 5-325 MG tablet  Every 6 hours PRN        02/16/22 0449             Note:  This document was prepared using Dragon voice recognition software and may include unintentional dictation errors.   Nephi Savage, Layla Maw, DO 02/16/22 0600

## 2022-02-17 ENCOUNTER — Telehealth: Payer: Self-pay | Admitting: Emergency Medicine

## 2022-02-17 NOTE — Telephone Encounter (Signed)
Ms. Demetrius Charity pls can you notify Mr Vandermeulen to follow up at the hand center in [facility]. Thank you  Lanora Manis   02/10/22 [facility] @ 11:27am 02/16/22 [facility] @ 12:43pm 02/17/22 LMOM to follow up with hand center in [facility]. @ 12:52pm

## 2022-02-25 ENCOUNTER — Other Ambulatory Visit: Payer: Self-pay

## 2022-03-01 ENCOUNTER — Other Ambulatory Visit: Payer: Self-pay

## 2022-03-01 ENCOUNTER — Other Ambulatory Visit: Payer: Self-pay | Admitting: Emergency Medicine

## 2022-03-02 ENCOUNTER — Other Ambulatory Visit: Payer: Self-pay

## 2022-03-08 ENCOUNTER — Other Ambulatory Visit: Payer: Self-pay

## 2022-03-19 ENCOUNTER — Other Ambulatory Visit: Payer: Self-pay

## 2022-03-30 ENCOUNTER — Other Ambulatory Visit: Payer: Self-pay

## 2022-03-30 ENCOUNTER — Telehealth: Payer: Self-pay | Admitting: Emergency Medicine

## 2022-03-30 DIAGNOSIS — K219 Gastro-esophageal reflux disease without esophagitis: Secondary | ICD-10-CM

## 2022-03-30 DIAGNOSIS — I10 Essential (primary) hypertension: Secondary | ICD-10-CM

## 2022-03-30 MED ORDER — AMLODIPINE BESYLATE 5 MG PO TABS
5.0000 mg | ORAL_TABLET | Freq: Every day | ORAL | 3 refills | Status: DC
  Filled 2022-03-30: qty 30, 30d supply, fill #0
  Filled 2022-04-22: qty 30, 30d supply, fill #1
  Filled 2022-05-19: qty 30, 30d supply, fill #2
  Filled 2022-06-13 – 2022-06-25 (×2): qty 30, 30d supply, fill #3

## 2022-03-30 MED ORDER — PANTOPRAZOLE SODIUM 40 MG PO TBEC
40.0000 mg | DELAYED_RELEASE_TABLET | Freq: Every day | ORAL | 3 refills | Status: DC
  Filled 2022-03-30: qty 30, 30d supply, fill #0
  Filled 2022-04-22: qty 30, 30d supply, fill #1
  Filled 2022-05-19: qty 30, 30d supply, fill #2
  Filled 2022-06-13 – 2022-06-25 (×2): qty 30, 30d supply, fill #3

## 2022-03-30 NOTE — Telephone Encounter (Signed)
Patient called requesting refills on Amlodipine and Protonix. Patient has active insurance and has appointment with new PCP on 07/14/22.   OK refills per Benjamine Mola.

## 2022-04-06 ENCOUNTER — Other Ambulatory Visit: Payer: Self-pay

## 2022-04-07 ENCOUNTER — Other Ambulatory Visit: Payer: Self-pay

## 2022-04-09 ENCOUNTER — Other Ambulatory Visit: Payer: Self-pay

## 2022-04-13 ENCOUNTER — Ambulatory Visit: Payer: Self-pay | Admitting: Gerontology

## 2022-04-22 ENCOUNTER — Other Ambulatory Visit: Payer: Self-pay

## 2022-04-23 ENCOUNTER — Other Ambulatory Visit: Payer: Self-pay

## 2022-05-04 ENCOUNTER — Other Ambulatory Visit: Payer: Self-pay

## 2022-05-19 ENCOUNTER — Other Ambulatory Visit: Payer: Self-pay

## 2022-06-01 ENCOUNTER — Other Ambulatory Visit: Payer: Self-pay

## 2022-06-13 ENCOUNTER — Other Ambulatory Visit: Payer: Self-pay

## 2022-06-14 ENCOUNTER — Other Ambulatory Visit: Payer: Self-pay

## 2022-06-17 ENCOUNTER — Other Ambulatory Visit: Payer: Self-pay

## 2022-06-18 ENCOUNTER — Other Ambulatory Visit: Payer: Self-pay

## 2022-06-18 MED ORDER — SERTRALINE HCL 25 MG PO TABS
25.0000 mg | ORAL_TABLET | Freq: Every day | ORAL | 4 refills | Status: AC
  Filled 2022-06-18: qty 30, 30d supply, fill #0
  Filled 2022-07-20: qty 30, 30d supply, fill #1
  Filled 2022-08-13: qty 30, 30d supply, fill #2
  Filled 2022-09-19: qty 30, 30d supply, fill #3

## 2022-06-22 ENCOUNTER — Other Ambulatory Visit: Payer: Self-pay

## 2022-06-25 ENCOUNTER — Other Ambulatory Visit: Payer: Self-pay

## 2022-07-14 ENCOUNTER — Other Ambulatory Visit: Payer: Self-pay

## 2022-07-14 MED ORDER — PANTOPRAZOLE SODIUM 40 MG PO TBEC
40.0000 mg | DELAYED_RELEASE_TABLET | Freq: Every day | ORAL | 3 refills | Status: AC
  Filled 2022-07-14: qty 30, 30d supply, fill #0
  Filled 2022-08-17: qty 30, 30d supply, fill #1
  Filled 2022-09-19 – 2022-11-18 (×2): qty 30, 30d supply, fill #2
  Filled 2023-01-01: qty 30, 30d supply, fill #3
  Filled 2023-01-28: qty 30, 30d supply, fill #4
  Filled 2023-03-11: qty 30, 30d supply, fill #5

## 2022-07-14 MED ORDER — MELOXICAM 15 MG PO TABS
15.0000 mg | ORAL_TABLET | Freq: Every day | ORAL | 3 refills | Status: AC | PRN
  Filled 2022-07-14: qty 30, 30d supply, fill #0
  Filled 2022-09-19: qty 30, 30d supply, fill #1

## 2022-07-14 MED ORDER — AMLODIPINE BESYLATE 5 MG PO TABS
5.0000 mg | ORAL_TABLET | Freq: Every day | ORAL | 3 refills | Status: AC
  Filled 2022-07-14: qty 30, 30d supply, fill #0
  Filled 2022-08-17: qty 30, 30d supply, fill #1
  Filled 2022-09-19 – 2022-10-06 (×2): qty 30, 30d supply, fill #2
  Filled 2022-10-31: qty 30, 30d supply, fill #3
  Filled 2023-01-01: qty 30, 30d supply, fill #4
  Filled 2023-01-28: qty 30, 30d supply, fill #5
  Filled 2023-03-11: qty 30, 30d supply, fill #6

## 2022-07-19 ENCOUNTER — Other Ambulatory Visit: Payer: Self-pay

## 2022-08-12 ENCOUNTER — Other Ambulatory Visit: Payer: Self-pay

## 2022-08-13 ENCOUNTER — Other Ambulatory Visit: Payer: Self-pay

## 2022-09-22 IMAGING — CR DG CHEST 2V
1 series · 2 of 2 positions shown · non-contrast
Comparison: 08/09/2011

CLINICAL DATA: Chest pain

EXAM:
CHEST - 2 VIEW

[Series 1: w chest pa · 0.14mm/px · 2 of 2 slices shown]
[im 1/2]
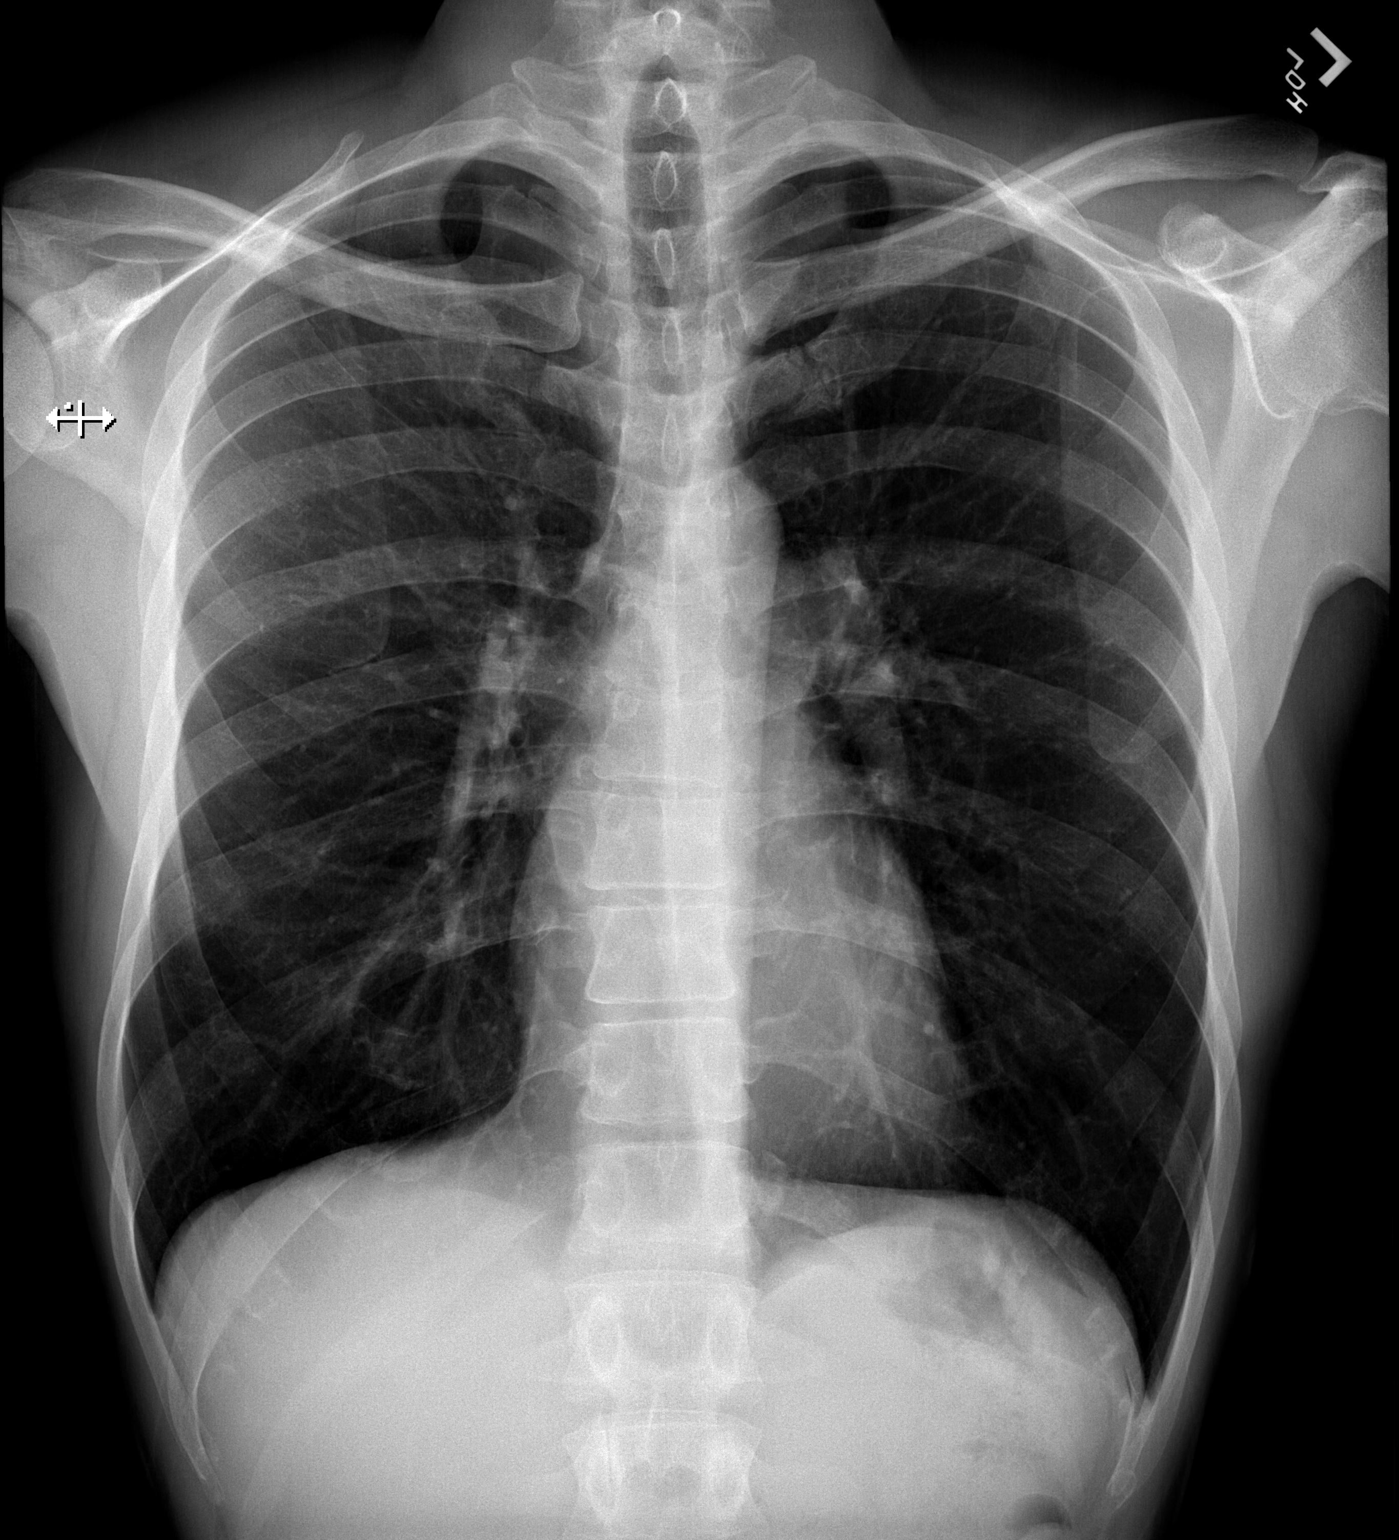
[im 2/2]
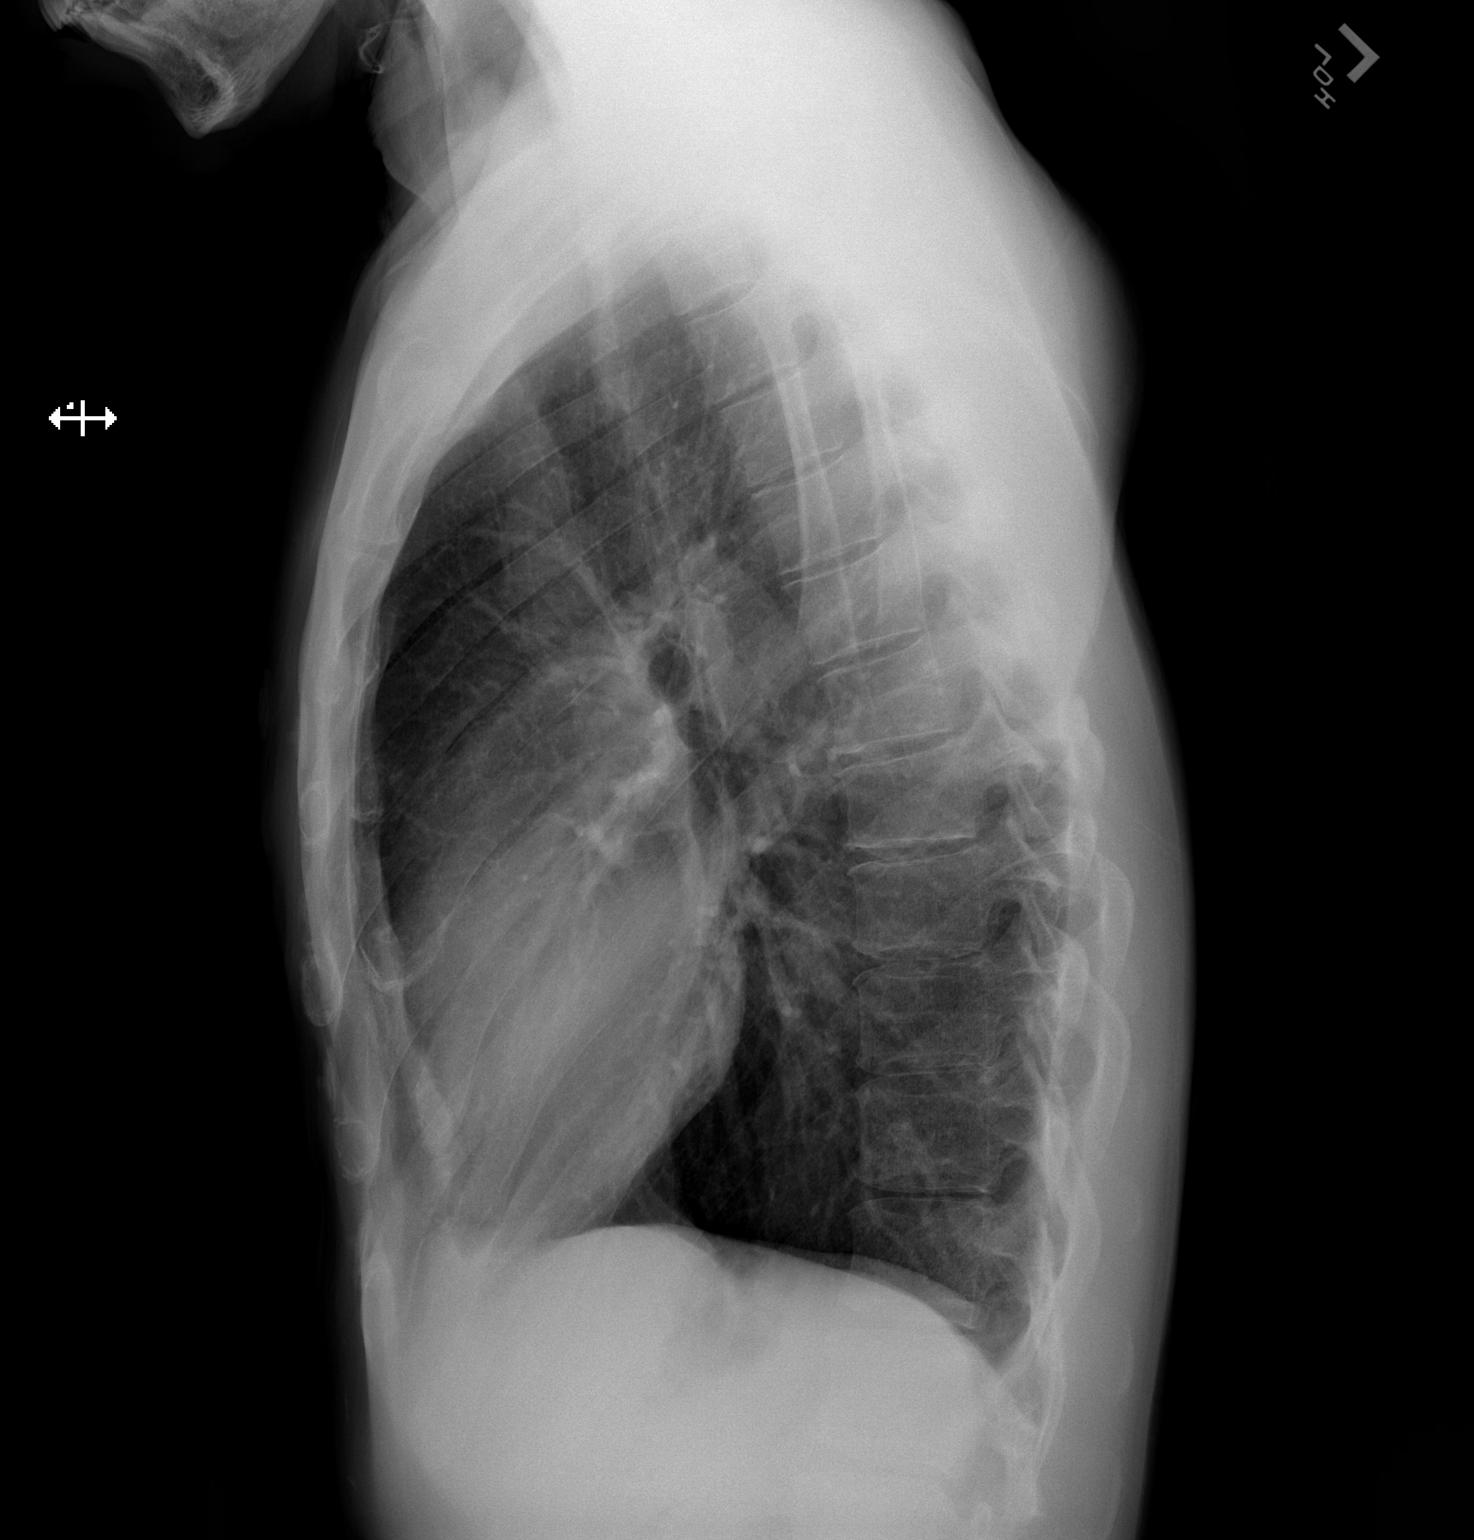

[2 of 2 positions shown; findings below may reference images not displayed]

FINDINGS: Normal heart size and mediastinal contours. No acute infiltrate or
edema. No effusion or pneumothorax. No acute osseous findings.
IMPRESSION: Negative chest.

## 2022-09-30 ENCOUNTER — Other Ambulatory Visit: Payer: Self-pay

## 2022-10-05 ENCOUNTER — Other Ambulatory Visit: Payer: Self-pay

## 2022-10-06 ENCOUNTER — Other Ambulatory Visit: Payer: Self-pay

## 2022-10-08 ENCOUNTER — Other Ambulatory Visit: Payer: Self-pay

## 2022-10-08 MED ORDER — SERTRALINE HCL 50 MG PO TABS
50.0000 mg | ORAL_TABLET | Freq: Every day | ORAL | 2 refills | Status: AC
  Filled 2022-10-08: qty 30, 30d supply, fill #0
  Filled 2022-11-18: qty 30, 30d supply, fill #1
  Filled 2023-01-28: qty 30, 30d supply, fill #2

## 2022-10-25 ENCOUNTER — Other Ambulatory Visit: Payer: Self-pay

## 2023-01-03 ENCOUNTER — Other Ambulatory Visit: Payer: Self-pay

## 2023-01-28 ENCOUNTER — Other Ambulatory Visit: Payer: Self-pay

## 2023-03-11 ENCOUNTER — Other Ambulatory Visit: Payer: Self-pay

## 2023-03-15 ENCOUNTER — Other Ambulatory Visit: Payer: Self-pay

## 2023-04-08 ENCOUNTER — Other Ambulatory Visit: Payer: Self-pay
